# Patient Record
Sex: Female | Born: 1985 | Race: White | Hispanic: No | Marital: Married | Smoking: Former smoker
Health system: Southern US, Community
[De-identification: ages and names within clinical notes are randomized; demographics above are authoritative.]

## PROBLEM LIST (undated history)

## (undated) DIAGNOSIS — K219 Gastro-esophageal reflux disease without esophagitis: Secondary | ICD-10-CM

## (undated) DIAGNOSIS — D649 Anemia, unspecified: Secondary | ICD-10-CM

## (undated) DIAGNOSIS — F419 Anxiety disorder, unspecified: Secondary | ICD-10-CM

## (undated) DIAGNOSIS — R112 Nausea with vomiting, unspecified: Secondary | ICD-10-CM

## (undated) DIAGNOSIS — Z789 Other specified health status: Secondary | ICD-10-CM

## (undated) DIAGNOSIS — T8859XA Other complications of anesthesia, initial encounter: Secondary | ICD-10-CM

## (undated) DIAGNOSIS — J45909 Unspecified asthma, uncomplicated: Secondary | ICD-10-CM

## (undated) DIAGNOSIS — Z9889 Other specified postprocedural states: Secondary | ICD-10-CM

## (undated) DIAGNOSIS — R569 Unspecified convulsions: Secondary | ICD-10-CM

## (undated) DIAGNOSIS — T4145XA Adverse effect of unspecified anesthetic, initial encounter: Secondary | ICD-10-CM

## (undated) HISTORY — PX: SKIN SURGERY: SHX2413

## (undated) HISTORY — DX: Anxiety disorder, unspecified: F41.9

## (undated) HISTORY — DX: Nausea with vomiting, unspecified: R11.2

## (undated) HISTORY — DX: Anemia, unspecified: D64.9

## (undated) HISTORY — DX: Nausea with vomiting, unspecified: Z98.890

## (undated) HISTORY — DX: Other complications of anesthesia, initial encounter: T88.59XA

## (undated) HISTORY — DX: Unspecified convulsions: R56.9

## (undated) HISTORY — DX: Gastro-esophageal reflux disease without esophagitis: K21.9

## (undated) HISTORY — DX: Unspecified asthma, uncomplicated: J45.909

## (undated) HISTORY — DX: Other specified health status: Z78.9

## (undated) HISTORY — DX: Other specified postprocedural states: Z98.890

## (undated) HISTORY — DX: Adverse effect of unspecified anesthetic, initial encounter: T41.45XA

---

## 2010-02-06 ENCOUNTER — Encounter: Admission: RE | Admit: 2010-02-06 | Discharge: 2010-02-06 | Payer: Self-pay | Admitting: Family Medicine

## 2016-01-09 DIAGNOSIS — J45909 Unspecified asthma, uncomplicated: Secondary | ICD-10-CM | POA: Insufficient documentation

## 2016-01-09 DIAGNOSIS — F988 Other specified behavioral and emotional disorders with onset usually occurring in childhood and adolescence: Secondary | ICD-10-CM | POA: Insufficient documentation

## 2016-01-09 DIAGNOSIS — J302 Other seasonal allergic rhinitis: Secondary | ICD-10-CM | POA: Insufficient documentation

## 2016-07-11 ENCOUNTER — Encounter: Payer: Self-pay | Admitting: Internal Medicine

## 2016-07-18 DIAGNOSIS — J029 Acute pharyngitis, unspecified: Secondary | ICD-10-CM | POA: Insufficient documentation

## 2016-07-18 DIAGNOSIS — K219 Gastro-esophageal reflux disease without esophagitis: Secondary | ICD-10-CM | POA: Insufficient documentation

## 2016-07-18 DIAGNOSIS — K589 Irritable bowel syndrome without diarrhea: Secondary | ICD-10-CM | POA: Insufficient documentation

## 2016-09-17 ENCOUNTER — Ambulatory Visit: Payer: Self-pay | Admitting: Internal Medicine

## 2017-09-11 DIAGNOSIS — R5383 Other fatigue: Secondary | ICD-10-CM | POA: Insufficient documentation

## 2017-09-11 DIAGNOSIS — N946 Dysmenorrhea, unspecified: Secondary | ICD-10-CM | POA: Insufficient documentation

## 2017-09-11 DIAGNOSIS — R87619 Unspecified abnormal cytological findings in specimens from cervix uteri: Secondary | ICD-10-CM | POA: Insufficient documentation

## 2017-09-11 DIAGNOSIS — A749 Chlamydial infection, unspecified: Secondary | ICD-10-CM | POA: Insufficient documentation

## 2017-09-11 DIAGNOSIS — F32A Depression, unspecified: Secondary | ICD-10-CM | POA: Insufficient documentation

## 2017-10-10 LAB — OB RESULTS CONSOLE HEPATITIS B SURFACE ANTIGEN: Hepatitis B Surface Ag: NEGATIVE

## 2017-10-10 LAB — OB RESULTS CONSOLE RPR: RPR: NONREACTIVE

## 2017-10-10 LAB — OB RESULTS CONSOLE ANTIBODY SCREEN: Antibody Screen: NEGATIVE

## 2017-10-10 LAB — OB RESULTS CONSOLE RUBELLA ANTIBODY, IGM: Rubella: IMMUNE

## 2017-10-10 LAB — OB RESULTS CONSOLE GC/CHLAMYDIA
Chlamydia: NEGATIVE
GC PROBE AMP, GENITAL: NEGATIVE

## 2017-10-10 LAB — OB RESULTS CONSOLE ABO/RH: RH Type: POSITIVE

## 2017-10-10 LAB — OB RESULTS CONSOLE HIV ANTIBODY (ROUTINE TESTING): HIV: NONREACTIVE

## 2017-11-04 NOTE — L&D Delivery Note (Signed)
Delivery Note Pt with bradycardia to 90's - pushed 10-3215min for delivery.  At 5:16 AM a viable and healthy female was delivered via Vaginal, Spontaneous (Presentation: OA;LOT  ).  APGAR: 5, 9; weight P .   Placenta status: delivered, intact .  Cord: 3V with the following complications: none .    Anesthesia:  epidural Episiotomy: None Lacerations: 1st degree;Perineal Suture Repair: 3.0 vicryl rapide Est. Blood Loss (mL): 150cc  Mom to postpartum.  Baby to Couplet care / Skin to Skin.  Kelli Perez 05/22/2018, 5:38 AM  D/w parents circumcision for female infant inc r/b/a - wish to proceed  A+/RI/Tdap in PNC/Contra?Samuella Cota/BR

## 2017-12-19 DIAGNOSIS — O4440 Low lying placenta NOS or without hemorrhage, unspecified trimester: Secondary | ICD-10-CM | POA: Insufficient documentation

## 2017-12-19 DIAGNOSIS — O4442 Low lying placenta NOS or without hemorrhage, second trimester: Secondary | ICD-10-CM | POA: Insufficient documentation

## 2018-04-13 LAB — OB RESULTS CONSOLE GBS: STREP GROUP B AG: POSITIVE

## 2018-05-18 ENCOUNTER — Telehealth (HOSPITAL_COMMUNITY): Payer: Self-pay | Admitting: *Deleted

## 2018-05-18 ENCOUNTER — Encounter (HOSPITAL_COMMUNITY): Payer: Self-pay | Admitting: *Deleted

## 2018-05-18 NOTE — Telephone Encounter (Signed)
Preadmission screen  

## 2018-05-20 ENCOUNTER — Encounter (HOSPITAL_COMMUNITY): Payer: Self-pay | Admitting: *Deleted

## 2018-05-20 ENCOUNTER — Inpatient Hospital Stay (HOSPITAL_COMMUNITY)
Admission: AD | Admit: 2018-05-20 | Discharge: 2018-05-20 | Disposition: A | Discharge status: Home / Self Care | Payer: 59 | Source: Ambulatory Visit | Attending: Obstetrics and Gynecology | Admitting: Obstetrics and Gynecology

## 2018-05-20 DIAGNOSIS — O471 False labor at or after 37 completed weeks of gestation: Secondary | ICD-10-CM

## 2018-05-20 NOTE — MAU Note (Signed)
PT SAYS SHE IS AN INDUCTION Thursday AT 0715.     THIS AM  FEELS UC'S- STRONG . VE IN OFFICE ON Friday- FT-1 CM.  DENIES HSV AND  MRSA.  GBS- POSITIVE

## 2018-05-20 NOTE — Progress Notes (Signed)
Dr Ellyn HackBovard notified of pts presenting complaints. Order to watch and recheck x 1hr. Pt may ambulate x 1hr

## 2018-05-20 NOTE — Discharge Instructions (Signed)
Braxton Hicks Contractions °Contractions of the uterus can occur throughout pregnancy, but they are not always a sign that you are in labor. You may have practice contractions called Braxton Hicks contractions. These false labor contractions are sometimes confused with true labor. °What are Braxton Hicks contractions? °Braxton Hicks contractions are tightening movements that occur in the muscles of the uterus before labor. Unlike true labor contractions, these contractions do not result in opening (dilation) and thinning of the cervix. Toward the end of pregnancy (32-34 weeks), Braxton Hicks contractions can happen more often and may become stronger. These contractions are sometimes difficult to tell apart from true labor because they can be very uncomfortable. You should not feel embarrassed if you go to the hospital with false labor. °Sometimes, the only way to tell if you are in true labor is for your health care provider to look for changes in the cervix. The health care provider will do a physical exam and may monitor your contractions. If you are not in true labor, the exam should show that your cervix is not dilating and your water has not broken. °If there are other health problems associated with your pregnancy, it is completely safe for you to be sent home with false labor. You may continue to have Braxton Hicks contractions until you go into true labor. °How to tell the difference between true labor and false labor °True labor °· Contractions last 30-70 seconds. °· Contractions become very regular. °· Discomfort is usually felt in the top of the uterus, and it spreads to the lower abdomen and low back. °· Contractions do not go away with walking. °· Contractions usually become more intense and increase in frequency. °· The cervix dilates and gets thinner. °False labor °· Contractions are usually shorter and not as strong as true labor contractions. °· Contractions are usually irregular. °· Contractions  are often felt in the front of the lower abdomen and in the groin. °· Contractions may go away when you walk around or change positions while lying down. °· Contractions get weaker and are shorter-lasting as time goes on. °· The cervix usually does not dilate or become thin. °Follow these instructions at home: °· Take over-the-counter and prescription medicines only as told by your health care provider. °· Keep up with your usual exercises and follow other instructions from your health care provider. °· Eat and drink lightly if you think you are going into labor. °· If Braxton Hicks contractions are making you uncomfortable: °? Change your position from lying down or resting to walking, or change from walking to resting. °? Sit and rest in a tub of warm water. °? Drink enough fluid to keep your urine pale yellow. Dehydration may cause these contractions. °? Do slow and deep breathing several times an hour. °· Keep all follow-up prenatal visits as told by your health care provider. This is important. °Contact a health care provider if: °· You have a fever. °· You have continuous pain in your abdomen. °Get help right away if: °· Your contractions become stronger, more regular, and closer together. °· You have fluid leaking or gushing from your vagina. °· You pass blood-tinged mucus (bloody show). °· You have bleeding from your vagina. °· You have low back pain that you never had before. °· You feel your baby’s head pushing down and causing pelvic pressure. °· Your baby is not moving inside you as much as it used to. °Summary °· Contractions that occur before labor are called Braxton   Hicks contractions, false labor, or practice contractions. °· Braxton Hicks contractions are usually shorter, weaker, farther apart, and less regular than true labor contractions. True labor contractions usually become progressively stronger and regular and they become more frequent. °· Manage discomfort from Braxton Hicks contractions by  changing position, resting in a warm bath, drinking plenty of water, or practicing deep breathing. °This information is not intended to replace advice given to you by your health care provider. Make sure you discuss any questions you have with your health care provider. °Document Released: 03/06/2017 Document Revised: 03/06/2017 Document Reviewed: 03/06/2017 °Elsevier Interactive Patient Education © 2018 [employer] ° °Fetal Movement Counts °Patient Name: ________________________________________________ Patient Due Date: ____________________ °What is a fetal movement count? °A fetal movement count is the number of times that you feel your baby move during a certain amount of time. This may also be called a fetal kick count. A fetal movement count is recommended for every pregnant woman. You may be asked to start counting fetal movements as early as week 28 of your pregnancy. °Pay attention to when your baby is most active. You may notice your baby's sleep and wake cycles. You may also notice things that make your baby move more. You should do a fetal movement count: °· When your baby is normally most active. °· At the same time each day. ° °A good time to count movements is while you are resting, after having something to eat and drink. °How do I count fetal movements? °1. Find a quiet, comfortable area. Sit, or lie down on your side. °2. Write down the date, the start time and stop time, and the number of movements that you felt between those two times. Take this information with you to your health care visits. °3. For 2 hours, count kicks, flutters, swishes, rolls, and jabs. You should feel at least 10 movements during 2 hours. °4. You may stop counting after you have felt 10 movements. °5. If you do not feel 10 movements in 2 hours, have something to eat and drink. Then, keep resting and counting for 1 hour. If you feel at least 4 movements during that hour, you may stop counting. °Contact a health care  provider if: °· You feel fewer than 4 movements in 2 hours. °· Your baby is not moving like he or she usually does. °Date: ____________ Start time: ____________ Stop time: ____________ Movements: ____________ °Date: ____________ Start time: ____________ Stop time: ____________ Movements: ____________ °Date: ____________ Start time: ____________ Stop time: ____________ Movements: ____________ °Date: ____________ Start time: ____________ Stop time: ____________ Movements: ____________ °Date: ____________ Start time: ____________ Stop time: ____________ Movements: ____________ °Date: ____________ Start time: ____________ Stop time: ____________ Movements: ____________ °Date: ____________ Start time: ____________ Stop time: ____________ Movements: ____________ °Date: ____________ Start time: ____________ Stop time: ____________ Movements: ____________ °Date: ____________ Start time: ____________ Stop time: ____________ Movements: ____________ °This information is not intended to replace advice given to you by your health care provider. Make sure you discuss any questions you have with your health care provider. °Document Released: 11/20/2006 Document Revised: 06/19/2016 Document Reviewed: 11/30/2015 °Elsevier Interactive Patient Education © 2018 [employer] ° °

## 2018-05-20 NOTE — H&P (Deleted)
  The note originally documented on this encounter has been moved the the encounter in which it belongs.  

## 2018-05-20 NOTE — H&P (Signed)
Kelli Perez is a 32 y.o. female G2P0010 at 38+ for IOL given term status.  +FM, no LOF,no VB, occ ctx.Eval in MAU 7/17, no change.  Relatively uncomplicated PNC - dated by LMP cw [place].  Negative genetic screening.  GBBS positive.  OB History    Gravida  2   Para      Term      Preterm      AB  1   Living        SAB  1   TAB      Ectopic      Multiple      Live Births            G1 TAB G2 present  No abn pap Remote h/o Chl  Past Medical History:  Diagnosis Date  . Complication of anesthesia   . Medical history non-contributory   . PONV (postoperative nausea and vomiting)    Past Surgical History:  Procedure Laterality Date  . SKIN SURGERY     birthmark removal on back   Family History: HTN, prostate CA Social History:  reports that she has never smoked. She has never used smokeless tobacco. She reports that she drank alcohol. She reports that she does not use drugs. married, customer service  Meds PNV All Cefzil, PCN, Sulfa - as child      Maternal Diabetes: No Genetic Screening: Normal Maternal Ultrasounds/Referrals: Normal Fetal Ultrasounds or other Referrals:  None Maternal Substance Abuse:  No Significant Maternal Medications:  None Significant Maternal Lab Results:  Lab values include: Group B Strep positive Other Comments:  None  Review of Systems  Constitutional: Negative.   HENT: Negative.   Eyes: Negative.   Respiratory: Negative.   Cardiovascular: Negative.   Gastrointestinal: Negative.   Genitourinary: Negative.   Musculoskeletal: Negative.   Skin: Negative.   Neurological: Negative.   Psychiatric/Behavioral: Negative.    Maternal Medical History:  Contractions: Frequency: irregular.   Perceived severity is moderate.    Fetal activity: Perceived fetal activity is normal.    Prenatal Complications - Diabetes: none.      Last menstrual period 08/11/2017. Maternal Exam:  Uterine Assessment: Contraction frequency is  irregular.   Abdomen: Patient reports no abdominal tenderness. Fundal height is appropriate for gestation.   Estimated fetal weight is 7.5-8#.   Fetal presentation: vertex     Physical Exam  Constitutional: She is oriented to person, place, and time. She appears well-developed and well-nourished.  HENT:  Head: Normocephalic and atraumatic.  Cardiovascular: Normal rate and regular rhythm.  Respiratory: Effort normal and breath sounds normal. No respiratory distress. She has no wheezes.  GI: Soft. Bowel sounds are normal. She exhibits no distension. There is no tenderness.  Musculoskeletal: Normal range of motion.  Neurological: She is alert and oriented to person, place, and time.  Skin: Skin is warm and dry.  Psychiatric: She has a normal mood and affect. Her behavior is normal.    Prenatal labs: ABO, Rh: A/Positive/-- (12/07 0000) Antibody: Negative (12/07 0000) Rubella: Immune (12/07 0000) RPR: Nonreactive (12/07 0000)  HBsAg: Negative (12/07 0000)  HIV: Non-reactive (12/07 0000)  GBS: Positive (06/10 0000)  EDC 05/18/18 - by LMP c/w [place] Tdap 4/23 Hgb 13.8/Plt336/Ur Cx neg/Chle neg/Gc neg/Varicella immune Nl NT First tri scr WNL AFP WNL/ glucola 109 Nl anat, LLP Repeat US resolved LLP Assessment/Plan: G2P0010 at 40+ for IOL  cytotec and AROM/Pitocin GBBS positive - AROM after abx - Clinda susceptible Expect SVD  Female -  circ paid   Lashanda Storlie Bovard-Stuckert 05/20/2018, 2:52 PM

## 2018-05-20 NOTE — MAU Note (Signed)
I have communicated with Dr. Ellyn HackBovard and reviewed vital signs:  Vitals:   05/20/18 0743 05/20/18 0812  BP: (!) 133/95 133/87  Pulse: 89 97  Resp:  18  Temp:      Vaginal exam:  Dilation: 1 Effacement (%): 50 Cervical Position: Posterior Station: -3 Presentation: Vertex Exam by:: Marvel PlanJessica Shawneen Deetz RN ,   Also reviewed contraction pattern and that non-stress test is reactive.  It has been documented that patient is contracting irregularly  with no cervical change over 2 hours not indicating active labor.  Patient denies any other complaints.  Based on this report provider has given order for discharge.  A discharge order and diagnosis entered by a provider.   Labor discharge instructions reviewed with patient.

## 2018-05-21 ENCOUNTER — Encounter (HOSPITAL_COMMUNITY): Payer: Self-pay | Admitting: *Deleted

## 2018-05-21 ENCOUNTER — Inpatient Hospital Stay (HOSPITAL_COMMUNITY)
Admission: AD | Admit: 2018-05-21 | Discharge: 2018-05-24 | DRG: 807 | Disposition: A | Discharge status: Home / Self Care | Payer: 59 | Attending: Obstetrics and Gynecology | Admitting: Obstetrics and Gynecology

## 2018-05-21 ENCOUNTER — Other Ambulatory Visit: Payer: Self-pay

## 2018-05-21 ENCOUNTER — Inpatient Hospital Stay (HOSPITAL_COMMUNITY)
Admission: RE | Admit: 2018-05-21 | Discharge: 2018-05-21 | Disposition: A | Discharge status: Home / Self Care | Payer: 59 | Source: Ambulatory Visit | Attending: Obstetrics and Gynecology | Admitting: Obstetrics and Gynecology

## 2018-05-21 DIAGNOSIS — Z3A40 40 weeks gestation of pregnancy: Secondary | ICD-10-CM

## 2018-05-21 DIAGNOSIS — Z3490 Encounter for supervision of normal pregnancy, unspecified, unspecified trimester: Secondary | ICD-10-CM

## 2018-05-21 DIAGNOSIS — O99824 Streptococcus B carrier state complicating childbirth: Secondary | ICD-10-CM | POA: Diagnosis present

## 2018-05-21 DIAGNOSIS — O480 Post-term pregnancy: Principal | ICD-10-CM | POA: Diagnosis present

## 2018-05-21 DIAGNOSIS — Z3493 Encounter for supervision of normal pregnancy, unspecified, third trimester: Secondary | ICD-10-CM

## 2018-05-21 LAB — COMPREHENSIVE METABOLIC PANEL
ALT: 21 U/L (ref 0–44)
ANION GAP: 12 (ref 5–15)
AST: 21 U/L (ref 15–41)
Albumin: 2.9 g/dL — ABNORMAL LOW (ref 3.5–5.0)
Alkaline Phosphatase: 99 U/L (ref 38–126)
BUN: 10 mg/dL (ref 6–20)
CALCIUM: 8.5 mg/dL — AB (ref 8.9–10.3)
CHLORIDE: 105 mmol/L (ref 98–111)
CO2: 18 mmol/L — AB (ref 22–32)
Creatinine, Ser: 0.62 mg/dL (ref 0.44–1.00)
Glucose, Bld: 94 mg/dL (ref 70–99)
Potassium: 3.5 mmol/L (ref 3.5–5.1)
SODIUM: 135 mmol/L (ref 135–145)
Total Bilirubin: 0.4 mg/dL (ref 0.3–1.2)
Total Protein: 6.6 g/dL (ref 6.5–8.1)

## 2018-05-21 LAB — CBC
HCT: 40.9 % (ref 36.0–46.0)
Hemoglobin: 14.2 g/dL (ref 12.0–15.0)
MCH: 31.3 pg (ref 26.0–34.0)
MCHC: 34.7 g/dL (ref 30.0–36.0)
MCV: 90.3 fL (ref 78.0–100.0)
PLATELETS: 243 10*3/uL (ref 150–400)
RBC: 4.53 MIL/uL (ref 3.87–5.11)
RDW: 13.9 % (ref 11.5–15.5)
WBC: 13.7 10*3/uL — AB (ref 4.0–10.5)

## 2018-05-21 LAB — TYPE AND SCREEN
ABO/RH(D): A POS
Antibody Screen: NEGATIVE

## 2018-05-21 LAB — ABO/RH: ABO/RH(D): A POS

## 2018-05-21 MED ORDER — TERBUTALINE SULFATE 1 MG/ML IJ SOLN
0.2500 mg | Freq: Once | INTRAMUSCULAR | Status: DC | PRN
  Filled 2018-05-21: qty 1

## 2018-05-21 MED ORDER — MISOPROSTOL 25 MCG QUARTER TABLET
25.0000 ug | ORAL_TABLET | ORAL | Status: DC | PRN
  Administered 2018-05-21: 25 ug via VAGINAL
  Filled 2018-05-21: qty 1

## 2018-05-21 MED ORDER — ACETAMINOPHEN 325 MG PO TABS: 650.0000 mg | ORAL_TABLET | ORAL | Status: DC | PRN

## 2018-05-21 MED ORDER — OXYTOCIN 40 UNITS IN LACTATED RINGERS INFUSION - SIMPLE MED
2.5000 [IU]/h | INTRAVENOUS | Status: DC
  Filled 2018-05-21: qty 1000

## 2018-05-21 MED ORDER — MISOPROSTOL 25 MCG QUARTER TABLET
ORAL_TABLET | ORAL | Status: AC
  Filled 2018-05-21: qty 1

## 2018-05-21 MED ORDER — OXYTOCIN BOLUS FROM INFUSION
500.0000 mL | Freq: Once | INTRAVENOUS | Status: AC
  Administered 2018-05-22: 500 mL via INTRAVENOUS

## 2018-05-21 MED ORDER — OXYCODONE-ACETAMINOPHEN 5-325 MG PO TABS: 2.0000 | ORAL_TABLET | ORAL | Status: DC | PRN

## 2018-05-21 MED ORDER — OXYTOCIN 40 UNITS IN LACTATED RINGERS INFUSION - SIMPLE MED
1.0000 m[IU]/min | INTRAVENOUS | Status: DC
  Filled 2018-05-21: qty 1000

## 2018-05-21 MED ORDER — OXYCODONE-ACETAMINOPHEN 5-325 MG PO TABS: 1.0000 | ORAL_TABLET | ORAL | Status: DC | PRN

## 2018-05-21 MED ORDER — ONDANSETRON HCL 4 MG/2ML IJ SOLN: 4.0000 mg | Freq: Four times a day (QID) | INTRAMUSCULAR | Status: DC | PRN

## 2018-05-21 MED ORDER — LACTATED RINGERS IV SOLN
INTRAVENOUS | Status: DC
  Administered 2018-05-21 – 2018-05-22 (×3): via INTRAVENOUS

## 2018-05-21 MED ORDER — LIDOCAINE HCL (PF) 1 % IJ SOLN
30.0000 mL | INTRAMUSCULAR | Status: DC | PRN
  Filled 2018-05-21: qty 30

## 2018-05-21 MED ORDER — CLINDAMYCIN PHOSPHATE 900 MG/50ML IV SOLN
900.0000 mg | Freq: Three times a day (TID) | INTRAVENOUS | Status: DC
  Administered 2018-05-21 – 2018-05-22 (×2): 900 mg via INTRAVENOUS
  Filled 2018-05-21 (×3): qty 50

## 2018-05-21 MED ORDER — BUTORPHANOL TARTRATE 1 MG/ML IJ SOLN: 1.0000 mg | INTRAMUSCULAR | Status: DC | PRN

## 2018-05-21 MED ORDER — OXYTOCIN 40 UNITS IN LACTATED RINGERS INFUSION - SIMPLE MED
1.0000 m[IU]/min | INTRAVENOUS | Status: DC
  Administered 2018-05-21: 2 m[IU]/min via INTRAVENOUS

## 2018-05-21 MED ORDER — SOD CITRATE-CITRIC ACID 500-334 MG/5ML PO SOLN: 30.0000 mL | ORAL | Status: DC | PRN

## 2018-05-21 MED ORDER — LACTATED RINGERS IV SOLN
500.0000 mL | INTRAVENOUS | Status: DC | PRN
  Administered 2018-05-22: 500 mL via INTRAVENOUS

## 2018-05-21 NOTE — Anesthesia Pain Management Evaluation Note (Signed)
  CRNA Pain Management Visit Note  Patient: Kelli Perez, 32 y.o., female  "Hello I am a member of the anesthesia team at [facility]. We have an anesthesia team available at all times to provide care throughout the hospital, including epidural management and anesthesia for C-section. I don't know your plan for the delivery whether it a natural birth, water birth, IV sedation, nitrous supplementation, doula or epidural, but we want to meet your pain goals."   1.Was your pain managed to your expectations on prior hospitalizations?   Yes   2.What is your expectation for pain management during this hospitalization?     Epidural  3.How can we help you reach that goal? Support prn  Record the patient's initial score and the patient's pain goal.   Pain: 4  Pain Goal: 6 [facility] wants you to be able to say your pain was always managed very well.  [facility] IncWRINK[facility],Paulina Muchmore 05/21/2018

## 2018-05-21 NOTE — Progress Notes (Signed)
Patient ID: Kelli Perez, female   DOB: 01/13/1986, 32 y.o.   MRN: [id number]   Pt having some ctx, doing well  AFVSS gen NAD FHTs 140's, mod var, + accels, category 1 toco irr q 2-4  SVE 3/50/-2  AROM for light mec D/w pt, will monitor  Plan for SVD

## 2018-05-21 NOTE — MAU Note (Signed)
Here for induction, waiting on room

## 2018-05-22 ENCOUNTER — Other Ambulatory Visit: Payer: Self-pay

## 2018-05-22 ENCOUNTER — Inpatient Hospital Stay (HOSPITAL_COMMUNITY): Payer: 59 | Admitting: Anesthesiology

## 2018-05-22 ENCOUNTER — Encounter (HOSPITAL_COMMUNITY): Payer: Self-pay | Admitting: Anesthesiology

## 2018-05-22 LAB — RPR: RPR Ser Ql: NONREACTIVE

## 2018-05-22 MED ORDER — DIPHENHYDRAMINE HCL 25 MG PO CAPS: 25.0000 mg | ORAL_CAPSULE | Freq: Four times a day (QID) | ORAL | Status: DC | PRN

## 2018-05-22 MED ORDER — DIBUCAINE 1 % RE OINT: 1.0000 "application " | TOPICAL_OINTMENT | RECTAL | Status: DC | PRN

## 2018-05-22 MED ORDER — PHENYLEPHRINE 40 MCG/ML (10ML) SYRINGE FOR IV PUSH (FOR BLOOD PRESSURE SUPPORT)
80.0000 ug | PREFILLED_SYRINGE | INTRAVENOUS | Status: DC | PRN
  Filled 2018-05-22: qty 5

## 2018-05-22 MED ORDER — OXYCODONE HCL 5 MG PO TABS: 10.0000 mg | ORAL_TABLET | ORAL | Status: DC | PRN

## 2018-05-22 MED ORDER — IBUPROFEN 600 MG PO TABS
ORAL_TABLET | ORAL | Status: AC
  Administered 2018-05-22: 600 mg via ORAL
  Filled 2018-05-22: qty 1

## 2018-05-22 MED ORDER — COCONUT OIL OIL: 1.0000 "application " | TOPICAL_OIL | Status: DC | PRN

## 2018-05-22 MED ORDER — OXYCODONE HCL 5 MG PO TABS: 5.0000 mg | ORAL_TABLET | ORAL | Status: DC | PRN

## 2018-05-22 MED ORDER — SIMETHICONE 80 MG PO CHEW: 80.0000 mg | CHEWABLE_TABLET | ORAL | Status: DC | PRN

## 2018-05-22 MED ORDER — SENNOSIDES-DOCUSATE SODIUM 8.6-50 MG PO TABS
2.0000 | ORAL_TABLET | ORAL | Status: DC
  Administered 2018-05-22 – 2018-05-24 (×2): 2 via ORAL
  Filled 2018-05-22 (×2): qty 2

## 2018-05-22 MED ORDER — LACTATED RINGERS IV SOLN: INTRAVENOUS | Status: DC

## 2018-05-22 MED ORDER — LACTATED RINGERS IV SOLN
500.0000 mL | Freq: Once | INTRAVENOUS | Status: AC
  Administered 2018-05-22: 500 mL via INTRAVENOUS

## 2018-05-22 MED ORDER — DIPHENHYDRAMINE HCL 50 MG/ML IJ SOLN: 12.5000 mg | INTRAMUSCULAR | Status: DC | PRN

## 2018-05-22 MED ORDER — BENZOCAINE-MENTHOL 20-0.5 % EX AERO: 1.0000 "application " | INHALATION_SPRAY | CUTANEOUS | Status: DC | PRN

## 2018-05-22 MED ORDER — ZOLPIDEM TARTRATE 5 MG PO TABS: 5.0000 mg | ORAL_TABLET | Freq: Every evening | ORAL | Status: DC | PRN

## 2018-05-22 MED ORDER — FENTANYL 2.5 MCG/ML BUPIVACAINE 1/10 % EPIDURAL INFUSION (WH - ANES)
14.0000 mL/h | INTRAMUSCULAR | Status: DC | PRN
  Administered 2018-05-22: 14 mL/h via EPIDURAL
  Filled 2018-05-22: qty 100

## 2018-05-22 MED ORDER — PHENYLEPHRINE 40 MCG/ML (10ML) SYRINGE FOR IV PUSH (FOR BLOOD PRESSURE SUPPORT)
80.0000 ug | PREFILLED_SYRINGE | INTRAVENOUS | Status: DC | PRN
  Filled 2018-05-22: qty 5
  Filled 2018-05-22: qty 10

## 2018-05-22 MED ORDER — ONDANSETRON HCL 4 MG/2ML IJ SOLN: 4.0000 mg | INTRAMUSCULAR | Status: DC | PRN

## 2018-05-22 MED ORDER — FENTANYL CITRATE (PF) 100 MCG/2ML IJ SOLN
INTRAMUSCULAR | Status: AC
  Administered 2018-05-22: 100 ug
  Filled 2018-05-22: qty 2

## 2018-05-22 MED ORDER — CALCIUM CARBONATE ANTACID 500 MG PO CHEW
2.0000 | CHEWABLE_TABLET | Freq: Three times a day (TID) | ORAL | Status: DC | PRN
Start: 2018-05-22 — End: 2018-05-24

## 2018-05-22 MED ORDER — ONDANSETRON HCL 4 MG PO TABS: 4.0000 mg | ORAL_TABLET | ORAL | Status: DC | PRN

## 2018-05-22 MED ORDER — ACETAMINOPHEN 325 MG PO TABS
650.0000 mg | ORAL_TABLET | ORAL | Status: DC | PRN
  Administered 2018-05-22: 650 mg via ORAL
  Filled 2018-05-22: qty 2

## 2018-05-22 MED ORDER — PRENATAL MULTIVITAMIN CH
1.0000 | ORAL_TABLET | Freq: Every day | ORAL | Status: DC
  Administered 2018-05-22 – 2018-05-24 (×3): 1 via ORAL
  Filled 2018-05-22 (×3): qty 1

## 2018-05-22 MED ORDER — EPHEDRINE 5 MG/ML INJ
10.0000 mg | INTRAVENOUS | Status: DC | PRN
  Filled 2018-05-22: qty 2

## 2018-05-22 MED ORDER — WITCH HAZEL-GLYCERIN EX PADS: 1.0000 "application " | MEDICATED_PAD | CUTANEOUS | Status: DC | PRN

## 2018-05-22 MED ORDER — LIDOCAINE HCL (PF) 1 % IJ SOLN
INTRAMUSCULAR | Status: DC | PRN
  Administered 2018-05-22: 7 mL via EPIDURAL
  Administered 2018-05-22: 5 mL via EPIDURAL

## 2018-05-22 MED ORDER — IBUPROFEN 600 MG PO TABS
600.0000 mg | ORAL_TABLET | Freq: Four times a day (QID) | ORAL | Status: DC
  Administered 2018-05-22 – 2018-05-24 (×10): 600 mg via ORAL
  Filled 2018-05-22 (×9): qty 1

## 2018-05-22 MED ORDER — FENTANYL CITRATE (PF) 100 MCG/2ML IJ SOLN: 100.0000 ug | INTRAMUSCULAR | Status: DC | PRN

## 2018-05-22 NOTE — Anesthesia Preprocedure Evaluation (Signed)
Anesthesia Evaluation  Patient identified by MRN, date of birth, ID band Patient awake    Reviewed: Allergy & Precautions, H&P , NPO status , Patient's Chart, lab work & pertinent test results  History of Anesthesia Complications (+) PONV and history of anesthetic complications  Airway Mallampati: II  TM Distance: >3 FB Neck ROM: full    Dental no notable dental hx. (+) Teeth Intact   Pulmonary neg pulmonary ROS,    Pulmonary exam normal breath sounds clear to auscultation       Cardiovascular negative cardio ROS Normal cardiovascular exam Rhythm:regular Rate:Normal     Neuro/Psych negative neurological ROS  negative psych ROS   GI/Hepatic negative GI ROS, Neg liver ROS,   Endo/Other  negative endocrine ROS  Renal/GU negative Renal ROS  negative genitourinary   Musculoskeletal negative musculoskeletal ROS (+)   Abdominal (+) + obese,   Peds  Hematology negative hematology ROS (+)   Anesthesia Other Findings   Reproductive/Obstetrics (+) Pregnancy                             Anesthesia Physical  Anesthesia Plan  ASA: II  Anesthesia Plan: Epidural   Post-op Pain Management:    Induction:   PONV Risk Score and Plan:   Airway Management Planned:   Additional Equipment:   Intra-op Plan:   Post-operative Plan:   Informed Consent: I have reviewed the patients History and Physical, chart, labs and discussed the procedure including the risks, benefits and alternatives for the proposed anesthesia with the patient or authorized representative who has indicated his/her understanding and acceptance.       Plan Discussed with:   Anesthesia Plan Comments:         Anesthesia Quick Evaluation  

## 2018-05-22 NOTE — Anesthesia Postprocedure Evaluation (Signed)
Anesthesia Post Note  Patient: Environmental managerMeghan Perez  Procedure(s) Performed: AN AD HOC LABOR EPIDURAL     Patient location during evaluation: Mother Baby Anesthesia Type: Epidural Level of consciousness: awake Pain management: pain level controlled Vital Signs Assessment: post-procedure vital signs reviewed and stable Respiratory status: spontaneous breathing Cardiovascular status: stable Postop Assessment: no headache, no backache, epidural receding, patient able to bend at knees, no apparent nausea or vomiting, adequate PO intake and able to ambulate Anesthetic complications: no    Last Vitals:  Vitals:   05/22/18 0825 05/22/18 0926  BP: 132/79 (!) 146/88  Pulse: 99 100  Resp: 16 18  Temp: 37.3 C 36.9 C  SpO2: 100% 100%    Last Pain:  Vitals:   05/22/18 0926  TempSrc: Oral  PainSc: 1    Pain Goal:                 [name]

## 2018-05-22 NOTE — Progress Notes (Signed)
Post Partum Day 0 Subjective: Doing well.  Bleeding normal.  Pain controlled  Objective: Blood pressure 132/79, pulse 99, temperature 99.1 F (37.3 C), temperature source Oral, resp. rate 16, height 5\' 8"  (1.727 m), weight 96.2 kg (212 lb), last menstrual period 08/11/2017, SpO2 100 %, unknown if currently breastfeeding.  Physical Exam:  General: alert and cooperative Fundus firm  Recent Labs    05/21/18 1717  HGB 14.2  HCT 40.9    Assessment/Plan: Routine care D/w pt circumcision and plan tomorrow AM  LOS: 1 day   Oliver PilaKathy W Tieisha Darden 05/22/2018, 9:09 AM

## 2018-05-22 NOTE — Anesthesia Procedure Notes (Signed)
Epidural Patient location during procedure: OB Start time: 05/22/2018 3:18 AM End time: 05/22/2018 3:22 AM  Staffing Anesthesiologist: Leilani AbleHatchett, Uzma Hellmer, MD Performed: anesthesiologist   Preanesthetic Checklist Completed: patient identified, site marked, surgical consent, pre-op evaluation, timeout performed, IV checked, risks and benefits discussed and monitors and equipment checked  Epidural Patient position: sitting Prep: site prepped and draped and DuraPrep Patient monitoring: continuous pulse ox and blood pressure Approach: midline Location: L3-L4 Injection technique: LOR air  Needle:  Needle type: Tuohy  Needle gauge: 17 G Needle length: 9 cm and 9 Needle insertion depth: 6 cm Catheter type: closed end flexible Catheter size: 19 Gauge Catheter at skin depth: 11 cm Test dose: negative and Other  Assessment Sensory level: T9 Events: blood not aspirated, injection not painful, no injection resistance, negative IV test and no paresthesia  Additional Notes Reason for block:procedure for pain

## 2018-05-22 NOTE — Lactation Note (Signed)
This note was copied from a baby's chart. Lactation Consultation Note  Patient Name: Kelli Perez OZHYQ'MToday's Date: 05/22/2018 Reason for consult: Initial assessment;1st time breastfeeding;Primapara;Term  P1 mother whose infant is now 5716 hours old.  Mother is requesting latch assistance.  Baby fussy as I entered and showing feeding cues.  Attempted to calm him down by letting him suck on my gloved finger.  He has a biting motion and sucks hard for 3-4 sucks before releasing.  Mother stated that is exactly what he does at the breast.  Mother's breasts are soft and non tender with everted nipples.  Assisted baby to latch on the right breast in the football hold.  After repeated attempts he was able to latch.  He continued to suck 3-4 times and would release.  Continued to redirect him to the breast and he would suck again for 3-4 sucks.  Burped baby and continued in this fashion.  He does have a wide open mouth when he latches and mother feels no pain.  Encouraged her to keep putting him to the breast every time he self releases.  After 10 minutes he burped and I assisted with latching in the football hold on the left breast.  At times he wants to "lick" the nipple so mother aware to be sure to wait for the wide gape.  Encouraged feeding 8-12 times/24 hours or more if he shows feeding cues.  Continue STS, breast massage and hand expression after feedings.  Mother demonstrated hand expression and was able to express a few drops of colostrum which I finger fed back to him.  Mother aware of feeding cues and will awaken baby by 3 hours if he does not self awaken.  Mom made aware of O/P services, breastfeeding support groups, community resources, and our phone # for post-discharge questions. Father present and sleeping on couch.  Rn updated.  Mother will call for assistance as needed.   Maternal Data Formula Feeding for Exclusion: No Has patient been taught Hand Expression?: Yes Does the patient have  breastfeeding experience prior to this delivery?: No  Feeding Feeding Type: Breast Fed Length of feed: 20 min  LATCH Score Latch: Repeated attempts needed to sustain latch, nipple held in mouth throughout feeding, stimulation needed to elicit sucking reflex.  Audible Swallowing: A few with stimulation  Type of Nipple: Everted at rest and after stimulation  Comfort (Breast/Nipple): Soft / non-tender  Hold (Positioning): Assistance needed to correctly position infant at breast and maintain latch.  LATCH Score: 7  Interventions Interventions: Breast feeding basics reviewed;Assisted with latch;Skin to skin;Breast massage;Hand express;Position options;Support pillows;Adjust position;Breast compression  Lactation Tools Discussed/Used WIC Program: No   Consult Status Consult Status: Follow-up Date: 05/23/18 Follow-up type: In-patient    Alexismarie Flaim R Henry Utsey 05/22/2018, 10:07 PM

## 2018-05-23 LAB — CBC
HEMATOCRIT: 34.6 % — AB (ref 36.0–46.0)
HEMOGLOBIN: 11.5 g/dL — AB (ref 12.0–15.0)
MCH: 30.4 pg (ref 26.0–34.0)
MCHC: 33.2 g/dL (ref 30.0–36.0)
MCV: 91.5 fL (ref 78.0–100.0)
Platelets: 204 10*3/uL (ref 150–400)
RBC: 3.78 MIL/uL — AB (ref 3.87–5.11)
RDW: 14.4 % (ref 11.5–15.5)
WBC: 12.8 10*3/uL — ABNORMAL HIGH (ref 4.0–10.5)

## 2018-05-23 NOTE — Progress Notes (Signed)
Post Partum Day 1 Subjective: no complaints, up ad lib and tolerating PO  Objective: Blood pressure 121/79, pulse 81, temperature 98.5 F (36.9 C), temperature source Oral, resp. rate 18, height 5\' 8"  (1.727 m), weight 96.2 kg (212 lb), last menstrual period 08/11/2017, SpO2 97 %, unknown if currently breastfeeding.  Physical Exam:  General: alert and cooperative Lochia: appropriate Uterine Fundus: firm   Recent Labs    05/21/18 1717 05/23/18 0532  HGB 14.2 11.5*  HCT 40.9 34.6*    Assessment/Plan: Plan for discharge tomorrow   LOS: 2 days   Kelli Perez 05/23/2018, 2:00 PM

## 2018-05-24 MED ORDER — ACETAMINOPHEN 325 MG PO TABS: 650.0000 mg | ORAL_TABLET | ORAL | 0 refills | Status: DC | PRN

## 2018-05-24 MED ORDER — IBUPROFEN 600 MG PO TABS: 600.0000 mg | ORAL_TABLET | Freq: Four times a day (QID) | ORAL | 0 refills | Status: DC

## 2018-05-24 NOTE — Lactation Note (Addendum)
This note was copied from a baby's chart. Lactation Consultation Note  Patient Name: Kelli Perez PewMeghan Cheema LKGMW'NToday's Date: 05/24/2018 Reason for consult: Follow-up assessment;Infant weight loss;Primapara;1st time breastfeeding;Term;Difficult latch;Other (Comment)(7% weight loss / using a NS , switched size to #24 )  Baby is 4652 hours old  LC reviewed doc flow sheets with parents / updated  Baby awake and hungry/ rooting.  Mom had applied the #20 NS and LC felt it appeared to snug, resized and the #24 NS fit better,  Filled with EBM and assisted to latch in the cross cradle position/ multiple swallows noted and baby fed for 8- 10 mins.  And was satisfied afterwards/ nipple well rounded and per mom comfortable with latch.  Discussed nutritive vs non - nutritive feeding patterns and the importance of watching for hanging out latched.  LC assessed oral cavity with gloved fingers, ( mom mentioned the Pedis noted a short frenulum ) .  LC noted a short anterior notch, baby able to raise tongue almost to the corners of the mouth and extend a short  Distance over the gum line, high palate, able to open mouth well to accommodate a #24 NS well with depth.  Also keeps is tongue down when sucking, and chewing as previously mentioned by another LC.  LC reviewed the LC plan:  Breast shells between feedings except when sleeping  Prior to latch - breast massage, hand express, pre-pump if needed - hand pump.  Apply NS #24 and instill EBM into the top / appetizer/ latch with firm support.  Feed for 15 - 20 mins , 30 mins max and supplement with EBM afterwards up to 30 ml.  Post pump both breast for 10 - 15 mins, save milk for the next feeding.  LC gave mom a written plan with the above information.  Sore nipple and engorgement prevention and tx reviewed.  Mom has hand pump, nipple shields, and DEBP Medela.  And shells.  Mom and dad receptive to returning [facility] this week,  And LC [facility] For appt. By Wednesday or Thursday / Friday at the latest.  Mother informed of post-discharge support and given phone number to the lactation department, including services for phone call assistance; out-patient appointments; and breastfeeding support group. List of other breastfeeding resources in the community given in the handout. Encouraged mother to call for problems or concerns related to breastfeeding.  Maternal Data Has patient been taught Hand Expression?: Yes  Feeding Feeding Type: Breast Fed Length of feed: 8 min(with EBM in the nipples)  LATCH Score Latch: Grasps breast easily, tongue down, lips flanged, rhythmical sucking.  Audible Swallowing: Spontaneous and intermittent  Type of Nipple: Everted at rest and after stimulation  Comfort (Breast/Nipple): Filling, red/small blisters or bruises, mild/mod discomfort  Hold (Positioning): Assistance needed to correctly position infant at breast and maintain latch.  LATCH Score: 8  Interventions Interventions: Breast feeding basics reviewed;Assisted with latch;Skin to skin;Breast massage;Breast compression;Adjust position;Support pillows;Position options;Shells;Hand pump;DEBP  Lactation Tools Discussed/Used Tools: Shells;Pump Nipple shield size: 20;24;Other (comment)(#24 NS a better fit ) Shell Type: Inverted Breast pump type: Double-Electric Breast Pump;Manual Pump Review: Setup, frequency, and cleaning;Milk Storage(LC reviewed )   Consult Status Consult Status: Follow-up Date: ([facility] and [facility]P appt. ) Follow-up type: Out-patient    Matilde SprangMargaret Ann Sasan Wilkie 05/24/2018, 10:06 AM

## 2018-05-24 NOTE — Plan of Care (Signed)
Progressing appropriately. Encouraged to call for assistance as needed, and for LATCH assessment.  

## 2018-05-24 NOTE — Discharge Summary (Signed)
OB Discharge Summary     Patient Name: Kelli PewMeghan Perez DOB: 03/01/1986 MRN: [id number]  Date of admission: 05/21/2018 Delivering MD: Sherian ReinBOVARD-STUCKERT, JODY   Date of discharge: 05/24/2018  Admitting diagnosis: 1411 Intrauterine pregnancy: 4870w4d     Secondary diagnosis:  Principal Problem:   SVD (spontaneous vaginal delivery) Active Problems:   Normal pregnancy, third trimester  Additional problems: none     Discharge diagnosis: Term Pregnancy Delivered                                                                                                Post partum procedures:none  Augmentation: AROM and Pitocin  Complications: None  Hospital course:  Induction of Labor With Vaginal Delivery   32 y.o. yo G2P1011 at 5070w4d was admitted to the hospital 05/21/2018 for induction of labor.  Indication for induction: Postdates.  Patient had an uncomplicated labor course as follows: Membrane Rupture Time/Date: 10:31 PM ,05/21/2018   Intrapartum Procedures: Episiotomy: None [1]                                         Lacerations:  1st degree [2];Perineal [11]  Patient had delivery of a Viable infant.  Information for the patient's newborn:  Kelli Perez, Boy Kelli Perez [[id number]][[id number]]  Delivery Method: Vag-Spont   05/22/2018  Details of delivery can be found in separate delivery note.  Patient had a routine postpartum course. Patient is discharged home 05/24/18.  Physical exam  Vitals:   05/23/18 0640 05/23/18 1400 05/23/18 2207 05/24/18 0601  BP: 121/79 130/85 133/86 (!) 136/94  Pulse: 81 80 88 83  Resp: 18 18  18   Temp: 98.5 F (36.9 C) 98.4 F (36.9 C) 98.9 F (37.2 C) 98.6 F (37 C)  TempSrc: Oral  Oral Oral  SpO2: 97% 98%    Weight:      Height:       General: alert and cooperative Lochia: appropriate Uterine Fundus: firm  Labs: Lab Results  Component Value Date   WBC 12.8 (H) 05/23/2018   HGB 11.5 (L) 05/23/2018   HCT 34.6 (L) 05/23/2018   MCV 91.5 05/23/2018   PLT 204 05/23/2018    CMP Latest Ref Rng & Units 05/21/2018  Glucose 70 - 99 mg/dL 94  BUN 6 - 20 mg/dL 10  Creatinine 1.300.44 - 8.651.00 mg/dL 7.840.62  Sodium 696135 - 295145 mmol/L 135  Potassium 3.5 - 5.1 mmol/L 3.5  Chloride 98 - 111 mmol/L 105  CO2 22 - 32 mmol/L 18(L)  Calcium 8.9 - 10.3 mg/dL 2.8(U8.5(L)  Total Protein 6.5 - 8.1 g/dL 6.6  Total Bilirubin 0.3 - 1.2 mg/dL 0.4  Alkaline Phos 38 - 126 U/L 99  AST 15 - 41 U/L 21  ALT 0 - 44 U/L 21    Discharge instruction: per After Visit Summary and "Baby and Me Booklet".  After visit meds:  Allergies as of 05/24/2018      Reactions   Cefzil [cefprozil] Other (See Comments)   Uknown   Penicillins  Other (See Comments)   Unknown   Sulfa Antibiotics Other (See Comments)   Uknown reaction.      Medication List    STOP taking these medications   calcium carbonate 500 MG chewable tablet Commonly known as:  TUMS - dosed in mg elemental calcium     TAKE these medications   acetaminophen 325 MG tablet Commonly known as:  TYLENOL Take 2 tablets (650 mg total) by mouth every 4 (four) hours as needed (for pain scale < 4).   ibuprofen 600 MG tablet Commonly known as:  ADVIL,MOTRIN Take 1 tablet (600 mg total) by mouth every 6 (six) hours.   prenatal multivitamin Tabs tablet Take 1 tablet by mouth at bedtime.       Diet: routine diet  Activity: Advance as tolerated. Pelvic rest for 6 weeks.   Outpatient follow up:6 weeks Follow up Appt:No future appointments. Follow up Visit:No follow-ups on file.  Postpartum contraception: Undecided  Newborn Data: Live born female  Birth Weight: 6 lb 13 oz (3090 g) APGAR: 5, 9  Newborn Delivery   Birth date/time:  05/22/2018 05:16:00 Delivery type:  Vaginal, Spontaneous     Baby Feeding: Breast Disposition:home with mother   05/24/2018 Oliver Pila, MD

## 2018-05-24 NOTE — Progress Notes (Signed)
Post Partum Day 2 Subjective: no complaints, up ad lib and tolerating PO  Objective: Blood pressure (!) 136/94, pulse 83, temperature 98.6 F (37 C), temperature source Oral, resp. rate 18, height 5\' 8"  (1.727 m), weight 96.2 kg (212 lb), last menstrual period 08/11/2017, SpO2 98 %, unknown if currently breastfeeding.  Physical Exam:  General: alert and cooperative Lochia: appropriate Uterine Fundus: firm   Recent Labs    05/21/18 1717 05/23/18 0532  HGB 14.2 11.5*  HCT 40.9 34.6*    Assessment/Plan: Discharge home   LOS: 3 days   Oliver PilaKathy W Nicco Reaume 05/24/2018, 10:28 AM

## 2018-05-26 ENCOUNTER — Ambulatory Visit: Payer: Self-pay

## 2018-05-26 NOTE — Lactation Note (Signed)
This note was copied from a baby's chart. Lactation Consult  Mother's reason for visit:  *** Visit Type:  *** Appointment Notes:  *** Consult:   Lactation Consultant:  Debera LatMilagros S Ettel Albergo  ________________________________________________________________________  Mom called LC OP phone number because she had a questions about the NS. Baby is 724 days old and she tried to wean baby off the NS yesterday but baby was not able to latch without it. He kept doing the same attempts he did at the hospital without latching. Advised mom to wait a bit longer until BF is well established to attempt to wean baby from NS. Encouraged mom to schedule an OP consultation to have her baby assessed. Mom voiced understanding and she said she already had one scheduled. She may try to reschedule her existing appt. No further questions or concerns reported at this time.  ________________________________________________________________________  Mother's Name: Kelli Perez Type of delivery:  Vaginal, Spontaneous Breastfeeding Experience:  *** Maternal Medical Conditions:   Maternal Medications:  ***  ________________________________________________________________________  Breastfeeding History (Post Discharge)  Frequency of breastfeeding:  *** Duration of feeding:  ***    Infant Intake and Output Assessment  Voids:  *** in 24 hrs.  Color:   Stools:  *** in 24 hrs.  Color:    ________________________________________________________________________  Maternal Breast Assessment  Breast:   Nipple:   Pain level:   Pain interventions:    _______________________________________________________________________ Feeding Assessment/Evaluation  Initial feeding assessment:  Infant's oral assessment:    Positioning:     LATCH documentation:  Latch:    Audible swallowing:    Type of nipple:    Comfort (Breast/Nipple):    Hold (Positioning):    LATCH score:  ***  Attached assessment:    Lips  flanged:    Lips untucked:    Suck assessment:    Tools:   Instructed on use and cleaning of tool:    Pre-feed weight:  *** g  (*** lb. *** oz.) Post-feed weight:  *** g (*** lb. *** oz.) Amount transferred:  *** ml Amount supplemented:  *** ml    Total amount pumped post feed:  R *** ml    L *** ml  Total amount transferred:  *** ml Total supplement given:  *** ml

## 2018-06-03 ENCOUNTER — Ambulatory Visit: Payer: Self-pay

## 2018-06-03 NOTE — Lactation Note (Signed)
This note was copied from a baby's chart. 06/03/2018  Name: Kelli Perez MRN: [id number] Date of Birth: 05/22/2018 Gestational Age: Gestational Age: [redacted]w[redacted]d Birth Weight: 109 oz Weight today:    7 pounds 10.5 ounces (3472 grams) with clean newborn diaper   Kelli Perez presents with mom for feeding assessment.   Kelli Perez has gained 597 grams in the last 10 days with an average daily weight gain of 60 grams a day.   Infant self awakens and feeds about every 2 hours at the breast, mostly using one breast per feeding. Mom reports she is unable to feed infant without the NS in place. She attempts each day without it and he has latched for about 5 minutes with the NS. Mom reports softening of breasts post feeding.   Infant with thick labial frenulum that wraps around the gum ridge. Upper lip stretches pretty well with blanching noted. Infant with thin anterior lingual frenulum noted when he opens his mouth. Infant with good tongue lateralization and extension. He is noted to have some decreased mid tongue elevation. Infant not able to latch without the NS for very long periods, when he does latch on without the NS, nipple is noted to be asymmetrical. Infant clicks on the breast at time. Infant with tongue thrusting and cheek dimpling on the breast and on gloved finger. Infant gaggy when finger inserted in his mouth. Mom reports she and dad both had to have labial frenulums that had to be revised. Discussed with mom how tongue and lip restrictions may effect BF and milk supply.   Reviewed with mom the importance of protecting milk supply while using the NS and until infant evaluated by Oral Specialist and or growth is followed for a while. Mom voiced understanding.   Infant fed on the left breast in the cross cradle hold. He fed actively for about 15 minutes and transferred 62 ml. Infant satisfied post feeding. Infant with some cheek dimpling at the breast. We did attempt without the NS and he was not able to  maintain latch and nipple was asymmetrical post latch.   Reviewed how to know infant is getting enough. Reviewed storing milk only after infant has been fed.   Infant to follow up with Dr. Chestine Spore on Monday August 5. [facility] has [facility] by [facility] with no plan to follow up . Mom aware of BF Support Groups at [facility]. Follow up with Lactation in 2 weeks.or 1-2 days post release if completed  Mom reports all questions/concerns have been answered at this time. Mom to call with any questions/concerns as needed.     General Information: Mother's reason for visit: feeding assessment Consult: Initial Lactation consultant: Noralee Stain RN,IBCLC Breastfeeding experience: going well, unable to feed without the NS   Maternal medications: Pre-natal vitamin, Motrin (ibuprofen)  Breastfeeding History: Frequency of breast feeding: every 2 hours Duration of feeding: 10-15 minutes, mostly one side per feeding  Supplementation: Supplement method: bottle(Nuk Natural)         Breast milk volume: 2-3 ounces Breast milk frequency: 1 x a day when dad is at home in the evening Total breast milk volume per day: 2-3 ounces Pump type: Medela pump in style Pump frequency: 2 x a day, 10 minutes Pump volume: 6 ounces in the AM   Infant Output Assessment: Voids per 24 hours: 10 Urine color: Clear yellow Stools per 24 hours: 6-7 Stool color: Yellow  Breast Assessment: Breast: Filling, Compressible Nipple: Erect Pain level: 1(mostly with pumping in  the morning) Pain interventions: Bra  Feeding Assessment: Infant oral assessment: Variance Infant oral assessment comment: Infant with thick labial frenulum that wraps around the gum ridge. Upper lip stretches pretty well with blanching noted. Infant with thin anterior lingual frenulum noted when he opens his mouth. Infant with good tongue lateralization and extension. He is noted to have some decreased mid tongue elevation. Infant not able to latch  without the NS for very long periods, when he does latch on without the NS, nipple is noted to be asymmetrical. Infant clicks on the breast at time. Infant with tongue thrusting and cheek dimpling on the breast and on gloved finger. Infant gaggy when finger inserted in his mouth. Positioning: Cross cradle(left breast) Latch: 1 - Repeated attempts needed to sustain latch, nipple held in mouth throughout feeding, stimulation needed to elicit sucking reflex. Audible swallowing: 2 - Spontaneous and intermittent Type of nipple: 2 - Everted at rest and after stimulation Comfort: 2 - Soft/non-tender Hold: 2 - No assistance needed to correctly position infant at breast LATCH score: 9 Latch assessment: Deep Lips flanged: Yes Suck assessment: Displays both Tools: Nipple shield 24 mm Pre-feed weight: 3472 grams Post feed weight: 3544 grams Amount transferred: 72 ml Amount supplemented: 0  Additional Feeding Assessment:                                    Totals: Total amount transferred: 72 ml Total supplement given: 0 Total amount pumped post feed: 0   Plan: 1. Offer infant the breast with feeding cues, offer both breasts with each feeding if he wants it 2. Use the # 24 nipple Shield as needed to keep infant latched 3. Try each day without the Nipple Shield to see when infant is able to feed without it 4. Keep infant awake at the breast with feedings 5. Massage/compress breast with feedings 6. Continue pumping 2-4 x a day with double electric breast pump post breast feeding for 10-15 minutes or when infant receiving a bottle to protect milk supply for as long as infant using the Nipple Shield  7. Infant needs 64-85 ml (2-2.5 Ounces) for 8 feedings a day or 510-680 ml (17-23 ounces) in 24 hours. Infant may want more or less depending on how often he feeds 8. When feeding infant a bottle use the paced bottle feeding method ([email or web address].[email or web address]) 9. Consider having infant evaluated by  Oral Specialist 10. Keep up the good work 11. Call for questions/cocnerns as needed [phone] 12. Thank you for allowing me to assist you today 13. Follow up with Lactation in 2 weeks or 1-2 days post release post release  Ed BlalockSharon S Chapel Silverthorn RN, IBCLC                                                    Silas FloodSharon S Azilee Pirro 06/03/2018, 3:43 PM

## 2018-06-17 ENCOUNTER — Ambulatory Visit: Payer: Self-pay

## 2018-06-17 NOTE — Lactation Note (Signed)
This note was copied from a baby's chart. 06/17/2018  Name: Kelli Perez MRN: [id number] Date of Birth: 05/22/2018 Gestational Age: Gestational Age: 2984w4d Birth Weight: 109 oz Weight today:    9 pounds 5 ounces (4226 grams) with clean newborn diaper  Kelli Perez presents today with mom for follow up feeding assessment. Infant post tongue/lip tie release on 8/13 by Dr. Orland MustardMcMurtry.   Infant has gained 754 grams in the last 14 days with an average daily weight gain of 54 grams a day.   Mom reports infant has been feeding every 1-2 hours, suspect growth spurt as infant is gaining well. Infant self awakens to feed and takes 1-2 breasts per feeding. Infant is still needing to use the NS, otherwise he will not latch and feed. Enc mom to use as needed and to work on weaning off by attempting to feed without it daily. Mom reports he fed last night without it.   Mom is pumping once a day and getting about 3 ounces. She is storing the milk. She reports they have not been giving infant bottles. Enc mom to reintroduce bottles about 4-6 weeks and give one a day or every 2-3 days to keep him familiar with them if parents want. Mom is not planning to return to work.   Mom reports infant fussy last night and is better today. Mom reports they are performing stretches as prescribed. Mom aware that it may take infant a few weeks to improve tongue function completely. Mom was shown suck training exercises to perform until tongue and lip heals.   Mom latched infant to the left breast in the cross cradle hold with the # 24 NS in place. Infant latched and fed well. Infant with gulping noted. Infant tolerated feeding well. Nipple pulls up well into the NS. Mom denies pain with feeding. Infant transferred 54 ml and self detached. Infant then latched to the right breast in the cross cradle hold and nursed again for about 15 minutes and transferred an additional 22 ml. Infant satisfied post feeding.   Infant noted to have high  pitched crowing sound with inhalation. It was more noticeable at the beginning of the feeding with gulping noted. Mom has noticed sound previously. Mom to inform pediatrician if if continues.   Infant to follow up with Pediatrician at 1 month. Mom aware of BF Support Groups. Infant to follow up with Lactation in 2 weeks at mom's request. Infant to follow up with Dr. Orland MustardMcMurtry in 2 weeks.   Mom reports all questions/concerns have been answered. Mom to call with questions/concerns prn.    General Information: Mother's reason for visit: follow up feeding assessment, post tongue/Lip tie release Consult: Follow-up Lactation consultant: [name] RN,IBCLC Breastfeeding experience: BF better, eating frequently, stopped bottle supplementation, still needs NS   Maternal medications: Pre-natal vitamin, Motrin (ibuprofen), Stool softener(Motrin and stool softener prn)  Breastfeeding History: Frequency of breast feeding: every 1-2 hours Duration of feeding: 5-20 minutes  Supplementation: Supplement method: bottle(Nuk Nipple, not giving supplement at this time)               Pump type: Medela pump in style Pump frequency: 1 x a day, 10 minutes Pump volume: 3 ounces  Infant Output Assessment: Voids per 24 hours: 10  Urine color: Clear yellow Stools per 24 hours: 4+ plus smears Stool color: Yellow  Breast Assessment: Breast: Filling, Compressible Nipple: Erect Pain level: 0 Pain interventions: Bra  Feeding Assessment:   Infant oral assessment comment: infant  with granulation tissue to upper lip. Upper lip flanges well. Tongue with diamond shaped granulation tissue. Tongue with much better mobility noted today. infant tender wtih manual manipulation today. His suck was decreased today, possible because of tenderness. infant less gaggy today, cheek dimpling better, no clicking noted wtih feeding, infant still needs NS for feeding.  Positioning: Cross cradle(left breast) Latch: 2 -  Grasps breast easily, tongue down, lips flanged, rhythmical sucking. Audible swallowing: 2 - Spontaneous and intermittent Type of nipple: 2 - Everted at rest and after stimulation Comfort: 2 - Soft/non-tender Hold: 2 - No assistance needed to correctly position infant at breast LATCH score: 10 Latch assessment: Deep Lips flanged: Yes Suck assessment: Displays both Tools: Nipple shield 24 mm Pre-feed weight: 4226 grams Post feed weight: 4280 grams Amount transferred: 54 ml Amount supplemented: 0  Additional Feeding Assessment: Infant oral assessment: Variance Infant oral assessment comment: see above Positioning: Cross cradle(right breast) Latch: 1 - Repeated attempts neede to sustain latch, nipple held in mouth throughout feeding, stimulation needed to elicit sucking reflex. Audible swallowing: 2 - Spontaneous and intermittent Type of nipple: 2 - Everted at rest and after stimulation Comfort: 2 - Soft/non-tender Hold: 2 - No assistance needed to correctly position infant at breast LATCH score: 9 Latch assessment: Deep Lips flanged: Yes Suck assessment: Displays both Tools: Nipple shield 24 mm Pre-feed weight: 4280 grams Post feed weight: 4302 grams Amount transferred: 22 ml Amount supplemented: 0  Totals: Total amount transferred: 76 ml Total supplement given: 0 Total amount pumped post feed: ddi not pump   Plan: 1. Offer infant the breast with feeding cues, offer both breasts with each feeding if he wants it 2. Use the # 24 nipple Shield as needed to keep infant latched 3. Try each day without the Nipple Shield to see when infant is able to feed without it, try when infant not angry and in the middle of a feeding.  4. Keep infant awake at the breast with feedings 5. Massage/compress breast with feedings 6. Continue pumping 2-4 x a day with double electric breast pump post breast feeding for 10-15 minutes or when infant receiving a bottle to protect milk supply for as  long as infant using the Nipple Shield  7. Infant needs 79-105  ml (2.5-3.5 Ounces) for 8 feedings a day or 630-840 ml (21-28 ounces) in 24 hours. Infant may want more or less depending on how often he feeds 8. When feeding infant a bottle use the paced bottle feeding method ([email or web address].[email or web address]) 9. Continue stretches per Dr. Orland MustardMcMurtry until tongue and lip are healed 10. Start suck training exercises tomorrow 5-6 x a day, 1-2 minutes each exercise for 2-3 weeks until tongue and lip are healed 11. Keep up the good work 12. Call for questions/concerns as needed [phone] 13. Thank you for allowing me to assist you today 14. Follow up with Lactation in 2 weeks  Ed BlalockSharon S Kristian Mogg RN, IBCLC                                                      Ed BlalockSharon S Marciel Offenberger 06/17/2018, 3:45 PM

## 2018-07-02 ENCOUNTER — Ambulatory Visit: Payer: Self-pay

## 2018-07-02 NOTE — Lactation Note (Signed)
This note was copied from a baby's chart. 07/02/2018  Name: Kelli Perez MRN: [id number] Date of Birth: 05/22/2018 Gestational Age: Gestational Age: 2752w4d Birth Weight: 109 oz Weight today:    10 pounds 9.8 ounces (4816 grams) with clean size 1  Diaper  Kelli Perez presents today with mom for follow up feeding assessment. Mom feels BF is still difficult at times.   Mom reports infant feeds every 1-4 hours and has decreased his feeding time to about 10 minutes per feeding. Infant will not latch without the NS and will not latch to the right breast well. Enc mom to keep trying the right breast and to latch without the NS. Try when sleepy, at beginning of the feeding and in the middle of the feeding.   Mom is pumping once a day, reviewed to maintain milk supply in the right breast it is recommended that she pump it the right breast 6-8 x a day. Mom is having difficulty getting time to pump. She has noticed her right breast has decreased in size to about 1/2 of the other.   Mom reports she keeps infant upright after feeding and he is not spitting. Infant burps well.   Infant has been fussy the last few days per mom and not wanting to nap, mom tearful today. Infant calmed after feeding.   Infant with stridor on inhalation that is increased since last visit, mom reports ped is aware and is following. Stridor stopped after feeding.   Infant latched to the left breast in the cradle hold with the # 24 nipple shield. Infant fussy with feeding and stridor increases at times. Infant transferred 128 ml in about 15 minutes. We then tried to relatch without the NS and he was not able to sustain latch without the NS. Infant satisfied post feeding.   Infant to follow up with Dr. Chestine Sporelark on Sept. 20. Mom aware of BF Support Groups. Infant to follow up with Lactation as needed. Mom to call with questions/concerns and follow up as needed.  Mom feels she has good support at home. She is aware she can call her OB if  she needs emotional support. Mom reports today has been the hardest day in a long time. Enc her to try and take time for herself.   General Information: Mother's reason for visit: Follow up feeding assessment Consult: Follow-up Lactation consultant: Noralee StainSharon Hice RN,IBCLC Breastfeeding experience: Sometimes difficult, is eating for shorter periods of time   Maternal medications: Pre-natal vitamin  Breastfeeding History: Frequency of breast feeding: every 1-4 hours Duration of feeding: 10 minutes usually  Supplementation: Supplement method: bottle         Breast milk volume: 2-3 ounces Breast milk frequency: 1 x a week   Pump type: Medela pump in style Pump frequency: 1 x a day Pump volume: 2 ounces  Infant Output Assessment: Voids per 24 hours: 10 Urine color: Clear yellow Stools per 24 hours: 2-3 Stool color: Yellow  Breast Assessment: Breast: Filling, Compressible Nipple: Erect Pain level: 1(with occasional latch and then approves) Pain interventions: Bra  Feeding Assessment: Infant oral assessment: Variance Infant oral assessment comment: tongue and lip have healed, infant will not latch without the nipple shield.  Positioning: Cross cradle(left breast) Latch: 2 - Grasps breast easily, tongue down, lips flanged, rhythmical sucking. Audible swallowing: 2 - Spontaneous and intermittent Type of nipple: 2 - Everted at rest and after stimulation Comfort: 2 - Soft/non-tender Hold: 2 - No assistance needed to correctly position infant at  breast LATCH score: 10 Latch assessment: Deep Lips flanged: Yes Suck assessment: Displays both Tools: Nipple shield 24 mm Pre-feed weight: 4816 grams Post feed weight: 4944 grams Amount transferred: 110 ml Amount supplemented: 0  Additional Feeding Assessment:                                    Totals: Total amount transferred: 128 ml Total supplement given: 0 Total amount pumped post feed: did not pump    Plan:  1. Offer infant the breast with feeding cues, offer both breasts with each feeding if he wants it 2. Use the # 24 nipple Shield as needed to keep infant latched 3. Try each day without the Nipple Shield to see when infant is able to feed without it, try when infant not angry and in the middle of a feeding.  4. Continue pumping 2-4 x a day with double electric breast pump post breast feeding for 10-15 minutes or when infant receiving a bottle to protect milk supply for as long as infant using the Nipple Shield . Would recommend you pump the right breast until infant is ready to latch 5. Infant needs 90-120 ml (3-4 Ounces) for 8 feedings a day or 720-960 ml (24-32 ounces) in 24 hours. Infant may want more or less depending on how often he feeds 6. When feeding infant a bottle use the paced bottle feeding method ([email or web address]) 7. Keep up the good work 8. Call for questions/concerns as needed [phone] 9. Thank you for allowing me to assist you today 10. Follow up with Lactation as needed   Ed Blalock RN, [employer]

## 2018-08-03 ENCOUNTER — Telehealth (HOSPITAL_COMMUNITY): Payer: Self-pay | Admitting: Lactation Services

## 2018-08-03 NOTE — Telephone Encounter (Signed)
Outgoing Telephone call : - LC returning call for this mom and obtained her answering machine/ left message to call the [facility] services number [phone].

## 2018-08-11 ENCOUNTER — Ambulatory Visit: Payer: Self-pay

## 2018-08-11 NOTE — Lactation Note (Addendum)
This note was copied from a baby's chart. 08/11/2018  Name: Kelli Perez MRN: [id number] Date of Birth: 05/22/2018 Gestational Age: Gestational Age: [redacted]w[redacted]d Birth Weight: 109 oz Weight today:    12 pounds 15.7 ounces (5888 grams) with clean size 1 diaper  Infant presents today with mom for feeding assessment. Mom reports infant has been refusing to nurse or nursing for short periods of time over the last 5-7 days. . Infant awake and alert, he is smiling and cooing. He seems distracted by nursing. Infant with periods of fussiness and would get upset when placed in nursing position. Infant with some rigidity and arching when upset. Mucous membranes are moist and infant voiding large wet diapers.   Infant has gained 1072 grams in the last 40 days with an average daily weight gain of 27 grams a day. Weight gain is within normal limits.   Mom reports infant weaned off the NS 2 weeks ago. He currently will not latch with it. Mom reports infant will nurse better at night and not much during the day. He is having good wet diapers and no stool in 3 days. Mom reports infant was stuffy a few days ago and they suctions some clear secretions from his nose. He has not had signs of illness or fever. Mom has been trying bicycling legs and tummy time with infant. Last stool was a huge runny breast milk looking stool per mom. Abdomen non distended.   Infant typically only wants to feed on the right breast, mom has gotten him to recently latch to the left breast. infant tens to gag on the right breast. When mom is pumping she only gets 1 ounce. She gets very little out on the right breast. She reports she pumped 4 ounces out of the left breast a few days ago. Mom reports he prefers to lay in the side lying position to eat.   Parents have tried feeding infant with a bottle and he is refusing and just biting/chewing on the bottle. Mom using Avent Natural Flow and NUK nipples, infant has refused both.   Mom is feeding  in quiet dark places since infant is distracted. Infant fighting naps per mom. Mom reports infant is a fussy baby.   Infant latched and fed off both breasts. He did pull on and off the breasts at times to talk and smile at mom. Infant was noted to choke some on the breast with feeding and would pull off. He fed better on the left breast vs the right breast. Infant was evaluated by Ped on Friday and did not have any issues at that time.   We tried cup feeding, feeding tube at the breast, bottle nipple, and curved tip syringe with finger feeding and infant would not suckle, he was chewing on every thing in his mouth. Infant not suckling on gloved finger today with some tongue thrusting noted when object placed in mouth. He allows liquid to run back out of his mouth.   Enc mom to protect her milk supply and to offer infant supplement by Dr Theora Gianotti Level 1 nipple or preemie nipple if choking or drooling on the Level 1 nipple. Mom has Dr. Theora Gianotti bottles at home. Enc STS and nursing vacation with infant. Enc mom to ask Ped about being evaluated by PT for feeding assessment. Mom to call Ped.   Infant to follow up with Ped at 4 months. Infant to follow up with Lactation in 1 week for weight check. Mom to call  with questions/concerns as needed.   Addendum: Spoke with Allie Dimmer, PT  on 10/9 @ 08:15 who recommended PT evaluation based on what is happening. LC will contact Peds office to request. This morning.     General Information: Mother's reason for visit: Feeding difficulty in the last week Consult: Follow-up Lactation consultant: Noralee Stain RN,IBCLC Breastfeeding experience: eating for very short times, pulling on and off   Maternal medications: Pre-natal vitamin  Breastfeeding History: Frequency of breast feeding: every 2 hours Duration of feeding: 2-10 minutes  Supplementation: Supplement method: bottle(will not take, Nuk and Avent nipples)               Pump type: Medela pump  in style Pump frequency: 1-2 x a day Pump volume: 1/2-4 ounces  Infant Output Assessment: Voids per 24 hours: 10 Urine color: Clear yellow Stools per 24 hours: none in 3 days Stool color: Yellow  Breast Assessment: Breast: Soft, Compressible Nipple: Erect Pain level: 0 Pain interventions: Bra  Feeding Assessment: Infant oral assessment: Variance Infant oral assessment comment: oral mobility has improved. infant with high palate, infant will not stay latched and will not take a bottle nipple Positioning: Cross cradle(left and right breasts for < 2 minutes each) Latch: 1 - Repeated attempts needed to sustain latch, nipple held in mouth throughout feeding, stimulation needed to elicit sucking reflex. Audible swallowing: 1 - A few with stimulation Type of nipple: 2 - Everted at rest and after stimulation Comfort: 2 - Soft/non-tender Hold: 2 - No assistance needed to correctly position infant at breast LATCH score: 8 Latch assessment: Deep Lips flanged: Yes Suck assessment: Displays both   Pre-feed weight: 5888 grams Post feed weight: 5906 grams Amount transferred: 18 ml Amount supplemented: 0. would not take  Additional Feeding Assessment:                                    Totals: Total amount transferred: 18 ml Total supplement given: would not take Total amount pumped post feed: 1.5 ounces   Plan:  1. Offer infant breast with feeding cues, try to offer breast when infant is sleepy and drowsy 2. Practice Skin to Skin with infant as much as possible, try baby wearing when possible 3. Offer breast milk in a bottle if infant will take it, try the Dr. Theora Gianotti Level 1 nipple for feeding, change to a Dr. Theora Gianotti Preemie if choking or drooling on the Level 1 nipple 4. Can try a sippy cup with milk in it also 5. Protect milk supply when infant not willing to latch by pumping with your double electric breast pump. Would recommend pumping 4-6 x a day 6. Feed  infant in a quiet dark environment when possible 7. Take a nursing Vacation with infant for a few days 8. Consider having infant evaluated by Everardo Beals PT at [facility], will most likely need a referral from your pediatrician 9. If feedings do not improve in the next few days, please call and have infant evaluated by Pediatrician 10. Restart suck training exercises as before.  11. Keep up the good work 12. Thank you for allowing me to assist you today 13. Please call with any questions/concerns as needed 14. Follow up with Lactation in 1 week   Ed Blalock RN, [employer]  Silas Flood Raschelle Wisenbaker 08/11/2018, 2:25 PM

## 2018-08-20 ENCOUNTER — Ambulatory Visit: Payer: Self-pay

## 2018-08-20 NOTE — Lactation Note (Signed)
This note was copied from a baby's chart. Lactation Consultation Note  Patient Name: Kelli Perez Today's Date: 08/20/2018     Maternal Data    Feeding    LATCH Score                   Interventions    Lactation Tools Discussed/Used     Consult Status      Ed Blalock 08/20/2018, 11:37 AM

## 2018-09-03 ENCOUNTER — Ambulatory Visit: Payer: Self-pay

## 2018-09-03 NOTE — Lactation Note (Signed)
This note was copied from a baby's chart.  09/03/2018  Name: Philamena Kramar MRN: [id number] Date of Birth: 05/22/2018 Gestational Age: Gestational Age: [redacted]w[redacted]d Birth Weight: 109 oz Weight today:    13 pounds 13.3 ounce (6274 grams) with clean size 1 diaper and onesie  Infant reports today with mom for follow up feeding assessment. Mom reports infant has started on Zantac on 10/10 and mom reports overall he is better. Mom hears some gurgling infant infant throat at times and infant does spit some into his mouth. She reports she cal lay him down more now.   Infant less tense with less stiffening noted today. Infant pleasant most of the time and was laughing and smiling and cooing. Infant was inconsolable at the end of the consult and crying. Mom attempted feeding and comfort measures.   Infant has gained 386 grams in the last 23 days with an average daily weight gain of 17 grams a day.   Mom reports infant is being offered a bottle once a day at least by mom and dad and has tried to the Nuk Natural that he used to take and will not take currently. He will also not use the NS and did not do well with the Comotomo bottles.   Infant feeds about every 2 hours for about 7-8 a side. He will eat a little longer at night.  Mom is doing occasional pumping when she can find time.   Infant did latch on both breasts and fed well for short bursts. Infant easily distracted. Infant feeds better at night per mom. She is taking him into quiet room to feed at times.   Mom drinks plenty of fluids and 1 cup of coffee a day. Mom only taking PNV.   Infant does seem to get overwhelmed with the flow on the left breast. He is noted to have a stridorous sound on inhalation when feeding mostly, mom reports Dr. Chestine Spore is aware.  Discussed mom keeping feeding log paying attention to acidic foods, dairy, and caffeine.  Discussed infant with Dr. Chestine Spore yesterday about concerns. Dr. Chestine Spore aware that we have a SLP that  would be available to see Madelin Rear to rule out oral issues. Mom aware also. Mom reports infant is doing better. Mom has a neighbor going through the same issues that has helped mom. mom is walking with her friend when they can, she does not go out with infant often.  Infant to follow up with Dr. Chestine Spore at 80 months of age. She has an appt tomorrow with him but may cancel it since she has talked with Dr. Chestine Spore since. Infant to follow up with Lactation as needed. Mom plans to attend BF Support Groups.   General Information: Mother's reason for visit: Follow up feeding assessment Consult: Follow-up Lactation consultant: Noralee Stain RN,IBCLC Breastfeeding experience: eating short periods of time, pulling on and off, has improved  since starting Zantac on 10/10   Maternal medications: Pre-natal vitamin  Breastfeeding History: Frequency of breast feeding: every 2 hours Duration of feeding: 5-8 minutes  Supplementation: Supplement method: bottle(Nuk, Comotomo)               Pump type: Medela pump in style Pump frequency: 1 x a day Pump volume: 1-2 ounces  Infant Output Assessment: Voids per 24 hours: 10  Urine color: Clear yellow Stools per 24 hours: every 4 days Stool color: Yellow  Breast Assessment: Breast: Soft, Compressible Nipple: Erect Pain level: 0 Pain interventions: Bra  Feeding  Assessment: Infant oral assessment: Variance Infant oral assessment comment: infant with high palate. infant with tongue thrusting when finger placed in mouth Positioning: Cross cradle(left and right breast) Latch: 1 - Repeated attempts needed to sustain latch, nipple held in mouth throughout feeding, stimulation needed to elicit sucking reflex. Audible swallowing: 1 - A few with stimulation Type of nipple: 2 - Everted at rest and after stimulation Comfort: 2 - Soft/non-tender Hold: 2 - No assistance needed to correctly position infant at breast LATCH score: 8 Latch assessment: Deep Lips  flanged: Yes Suck assessment: Displays both   Pre-feed weight: 6274 grams Post feed weight: 6324 grams Amount transferred: 50 ml + ate a little longer at the end of assessment Amount supplemented: 0  Additional Feeding Assessment:                                    Totals: Total amount transferred: 50 Total supplement given: 0 Total amount pumped post feed: did not pump   Engineering geologist, IBCLC  Plan:  1. Offer infant breast with feeding cues 2. Practice Skin to Skin with infant as much as possible, try baby wearing when possible 3. Have someone else feed him the bottles, may have to try other kinds. Slow flow would be best for him 4. Can try a sippy cup with milk in it also 5. Feed infant in a quiet dark environment when possible 6. Take a nursing Vacation with infant for a few days 7. Keep up the good work 8. Thank you for allowing me to assist you today 9. Please call with any questions/concerns as needed 10. Follow up with Lactation as needed     Ed Blalock 09/03/2018, 11:53 AM

## 2018-09-10 ENCOUNTER — Telehealth: Payer: Self-pay | Admitting: *Deleted

## 2018-09-10 ENCOUNTER — Telehealth (HOSPITAL_COMMUNITY): Payer: Self-pay | Admitting: Lactation Services

## 2018-09-10 NOTE — Telephone Encounter (Signed)
Mom called and feels like she has a sinus infection. Mom plans to call MD to inquire about taking a Zpack. Read mom information from Bobbye Morton, Medications and Mother's Milk 2019 that Azithromycin in considered an L2 (limited data-Probably compatible with no reported pediatric concerns via breasts milk.  Mom voiced understanding. Mom to call back with further questions/cocnerns as needed.

## 2018-09-10 NOTE — Telephone Encounter (Signed)
Aundra Millet called and left a voicemail.She states she has brought her son here a few times. States she thinks she has a sinus infection and wants to know if it is ok to take a zpack or antibiotic. Per chart review she is not a patient in this clinic. Will forward to Advertising copywriter.

## 2019-05-24 ENCOUNTER — Telehealth (HOSPITAL_COMMUNITY): Payer: Self-pay | Admitting: Lactation Services

## 2019-05-24 NOTE — Telephone Encounter (Signed)
Kelli Perez called regarding a recurrent plugged duct on one nipple.  Recently she underwent treatment for mastitis, but is still having pain on this one nipple.  Kelli Perez can see the end of the plug.  She has been using epson salt soaks and olive oil.  Kelli Perez denies red streaks or pain in her breasts.  Recommended- -Warm, moist soaks to her breast on affected side, submerging breast in bowl of warm water while massaging. -Massage prior to breast feeding, changing positions to help drain all the ducts -Lecithin supplement (Kelli Perez has just ordered some on Yoe) - To call again if signs and symptoms of Mastitis or abscess in her breast.  Fever, reddened and warm area on breast, headache and flu-like symptoms.

## 2019-05-31 ENCOUNTER — Telehealth (HOSPITAL_COMMUNITY): Payer: Self-pay

## 2019-05-31 NOTE — Telephone Encounter (Signed)
Mother phoned to inform that Mastitis from 3 weeks ago has resolved. She now has what she calls a Milk Blister on the underside of her nipple. Mother describes that she has a white round dot on her nipple.  She reports that she is taking Sunflower lesithin. She reports that it is some better. She reports that it is some bothersome. She denies minimal pain. She denies engorgement . She reports that her breast does feel hot at times.  She phone Dr Melba Coon and Dr Melba Coon recommended that she phone [facility] office .  Mother was given suggestions on resolving milk bleb.   Advised mother to change infants positions and alternate breast frequently. Suggested side lying.  Suggested that she do gentle massage behind the nipple with olive oil, maybe while in the shower and in an upward motion.  Advised mother to look in mirror to see if there is any redness  Suggested that mother continue to take lesitin . Mother to try warm compress. Suggested that mother follow up with [facility] outpatient if unable to resolve and continued pain. Mother may also discuss again with Dr Melba Coon.

## 2019-10-14 ENCOUNTER — Other Ambulatory Visit: Payer: Self-pay

## 2019-10-14 DIAGNOSIS — Z20822 Contact with and (suspected) exposure to covid-19: Secondary | ICD-10-CM

## 2019-10-16 LAB — NOVEL CORONAVIRUS, NAA: SARS-CoV-2, NAA: NOT DETECTED

## 2020-04-11 DIAGNOSIS — O99342 Other mental disorders complicating pregnancy, second trimester: Secondary | ICD-10-CM | POA: Insufficient documentation

## 2020-04-11 LAB — OB RESULTS CONSOLE GC/CHLAMYDIA
Chlamydia: NEGATIVE
Gonorrhea: NEGATIVE

## 2020-04-11 LAB — OB RESULTS CONSOLE HEPATITIS B SURFACE ANTIGEN: Hepatitis B Surface Ag: NEGATIVE

## 2020-04-11 LAB — OB RESULTS CONSOLE VARICELLA ZOSTER ANTIBODY, IGG: Varicella: IMMUNE

## 2020-04-11 LAB — OB RESULTS CONSOLE HIV ANTIBODY (ROUTINE TESTING): HIV: NONREACTIVE

## 2020-04-11 LAB — OB RESULTS CONSOLE RUBELLA ANTIBODY, IGM: Rubella: IMMUNE

## 2020-07-31 DIAGNOSIS — Z20822 Contact with and (suspected) exposure to covid-19: Secondary | ICD-10-CM | POA: Insufficient documentation

## 2020-11-04 NOTE — L&D Delivery Note (Signed)
Operative Delivery Note At 1:57 PM a viable and healthy female was delivered via Vaginal, Spontaneous.  Presentation: vertex; Position: Right,, Occiput,, Anterior; Station: +4.  Delivery of the head: spontaneous  ,   Second maneuver:  Woods screw Third maneuver: ,  Mcroberts Fourth maneuver: ,  Suprapubic pressure Fifth maneuver: ,  Sweep post arm across body to decompress shoulder diameter Total time 45sec   APGAR: 7, 9; weight pending.   Placenta status: spontaneous, intact.   Cord:  with the following complications: none.  Cord pH: na  Anesthesia:  epidural Episiotomy: None Lacerations: 1st degree Suture Repair: 3.0 vicryl Est. Blood Loss (mL):  200  Mom to postpartum.  Baby to Couplet care / Skin to Skin.  Joyell Emami J 11/09/2020, 2:19 PM

## 2020-11-09 ENCOUNTER — Inpatient Hospital Stay (HOSPITAL_COMMUNITY): Payer: BC Managed Care – PPO | Admitting: Anesthesiology

## 2020-11-09 ENCOUNTER — Encounter (HOSPITAL_COMMUNITY): Payer: Self-pay | Admitting: Obstetrics and Gynecology

## 2020-11-09 ENCOUNTER — Inpatient Hospital Stay (HOSPITAL_COMMUNITY)
Admission: AD | Admit: 2020-11-09 | Discharge: 2020-11-10 | DRG: 807 | Disposition: A | Discharge status: Home / Self Care | Payer: BC Managed Care – PPO | Attending: Obstetrics and Gynecology | Admitting: Obstetrics and Gynecology

## 2020-11-09 ENCOUNTER — Other Ambulatory Visit: Payer: Self-pay

## 2020-11-09 DIAGNOSIS — O99824 Streptococcus B carrier state complicating childbirth: Principal | ICD-10-CM | POA: Diagnosis present

## 2020-11-09 DIAGNOSIS — O26893 Other specified pregnancy related conditions, third trimester: Secondary | ICD-10-CM | POA: Diagnosis present

## 2020-11-09 DIAGNOSIS — Z20822 Contact with and (suspected) exposure to covid-19: Secondary | ICD-10-CM | POA: Diagnosis present

## 2020-11-09 DIAGNOSIS — Z3A39 39 weeks gestation of pregnancy: Secondary | ICD-10-CM | POA: Diagnosis not present

## 2020-11-09 DIAGNOSIS — F419 Anxiety disorder, unspecified: Secondary | ICD-10-CM | POA: Diagnosis present

## 2020-11-09 DIAGNOSIS — O99344 Other mental disorders complicating childbirth: Secondary | ICD-10-CM | POA: Diagnosis present

## 2020-11-09 LAB — URINALYSIS, ROUTINE W REFLEX MICROSCOPIC
Bilirubin Urine: NEGATIVE
Glucose, UA: NEGATIVE mg/dL
Ketones, ur: NEGATIVE mg/dL
Nitrite: NEGATIVE
Protein, ur: NEGATIVE mg/dL
Specific Gravity, Urine: 1.006 (ref 1.005–1.030)
pH: 6 (ref 5.0–8.0)

## 2020-11-09 LAB — CBC
HCT: 42.1 % (ref 36.0–46.0)
Hemoglobin: 13.8 g/dL (ref 12.0–15.0)
MCH: 28.9 pg (ref 26.0–34.0)
MCHC: 32.8 g/dL (ref 30.0–36.0)
MCV: 88.1 fL (ref 80.0–100.0)
Platelets: 266 10*3/uL (ref 150–400)
RBC: 4.78 MIL/uL (ref 3.87–5.11)
RDW: 13.4 % (ref 11.5–15.5)
WBC: 13.4 10*3/uL — ABNORMAL HIGH (ref 4.0–10.5)
nRBC: 0 % (ref 0.0–0.2)

## 2020-11-09 LAB — RESP PANEL BY RT-PCR (FLU A&B, COVID) ARPGX2
Influenza A by PCR: NEGATIVE
Influenza B by PCR: NEGATIVE
SARS Coronavirus 2 by RT PCR: NEGATIVE

## 2020-11-09 LAB — OB RESULTS CONSOLE GBS: GBS: POSITIVE

## 2020-11-09 LAB — RPR: RPR Ser Ql: NONREACTIVE

## 2020-11-09 LAB — TYPE AND SCREEN
ABO/RH(D): A POS
Antibody Screen: NEGATIVE

## 2020-11-09 MED ORDER — LACTATED RINGERS IV SOLN: INTRAVENOUS | Status: DC

## 2020-11-09 MED ORDER — SOD CITRATE-CITRIC ACID 500-334 MG/5ML PO SOLN: 30.0000 mL | ORAL | Status: DC | PRN

## 2020-11-09 MED ORDER — OXYCODONE-ACETAMINOPHEN 5-325 MG PO TABS: 1.0000 | ORAL_TABLET | ORAL | Status: DC | PRN

## 2020-11-09 MED ORDER — OXYTOCIN BOLUS FROM INFUSION
333.0000 mL | Freq: Once | INTRAVENOUS | Status: AC
  Administered 2020-11-09: 333 mL via INTRAVENOUS

## 2020-11-09 MED ORDER — ZOLPIDEM TARTRATE 5 MG PO TABS: 5.0000 mg | ORAL_TABLET | Freq: Every evening | ORAL | Status: DC | PRN

## 2020-11-09 MED ORDER — ONDANSETRON HCL 4 MG/2ML IJ SOLN: 4.0000 mg | INTRAMUSCULAR | Status: DC | PRN

## 2020-11-09 MED ORDER — METHYLERGONOVINE MALEATE 0.2 MG/ML IJ SOLN: 0.2000 mg | INTRAMUSCULAR | Status: DC | PRN

## 2020-11-09 MED ORDER — PHENYLEPHRINE 40 MCG/ML (10ML) SYRINGE FOR IV PUSH (FOR BLOOD PRESSURE SUPPORT)
80.0000 ug | PREFILLED_SYRINGE | INTRAVENOUS | Status: DC | PRN
  Filled 2020-11-09: qty 10

## 2020-11-09 MED ORDER — EPHEDRINE 5 MG/ML INJ: 10.0000 mg | INTRAVENOUS | Status: DC | PRN

## 2020-11-09 MED ORDER — DIBUCAINE (PERIANAL) 1 % EX OINT: 1.0000 "application " | TOPICAL_OINTMENT | CUTANEOUS | Status: DC | PRN

## 2020-11-09 MED ORDER — METHYLERGONOVINE MALEATE 0.2 MG PO TABS: 0.2000 mg | ORAL_TABLET | ORAL | Status: DC | PRN

## 2020-11-09 MED ORDER — LACTATED RINGERS IV SOLN
500.0000 mL | INTRAVENOUS | Status: DC | PRN
  Administered 2020-11-09: 1000 mL via INTRAVENOUS

## 2020-11-09 MED ORDER — DIPHENHYDRAMINE HCL 50 MG/ML IJ SOLN: 12.5000 mg | Freq: Four times a day (QID) | INTRAMUSCULAR | Status: DC | PRN

## 2020-11-09 MED ORDER — ONDANSETRON HCL 4 MG/2ML IJ SOLN: 4.0000 mg | Freq: Four times a day (QID) | INTRAMUSCULAR | Status: DC | PRN

## 2020-11-09 MED ORDER — SODIUM CHLORIDE (PF) 0.9 % IJ SOLN
INTRAMUSCULAR | Status: DC | PRN
  Administered 2020-11-09: 12 mL/h via EPIDURAL

## 2020-11-09 MED ORDER — FLEET ENEMA 7-19 GM/118ML RE ENEM: 1.0000 | ENEMA | RECTAL | Status: DC | PRN

## 2020-11-09 MED ORDER — VANCOMYCIN HCL IN DEXTROSE 1-5 GM/200ML-% IV SOLN
1000.0000 mg | Freq: Two times a day (BID) | INTRAVENOUS | Status: DC
  Administered 2020-11-09: 1000 mg via INTRAVENOUS
  Filled 2020-11-09: qty 200

## 2020-11-09 MED ORDER — COCONUT OIL OIL
1.0000 "application " | TOPICAL_OIL | Status: DC | PRN
  Administered 2020-11-10: 1 via TOPICAL

## 2020-11-09 MED ORDER — DIPHENHYDRAMINE HCL 25 MG PO CAPS: 25.0000 mg | ORAL_CAPSULE | Freq: Four times a day (QID) | ORAL | Status: DC | PRN

## 2020-11-09 MED ORDER — OXYTOCIN-SODIUM CHLORIDE 30-0.9 UT/500ML-% IV SOLN
2.5000 [IU]/h | INTRAVENOUS | Status: DC
  Administered 2020-11-09: 2.5 [IU]/h via INTRAVENOUS
  Filled 2020-11-09: qty 500

## 2020-11-09 MED ORDER — ACETAMINOPHEN 325 MG PO TABS: 650.0000 mg | ORAL_TABLET | ORAL | Status: DC | PRN

## 2020-11-09 MED ORDER — BENZOCAINE-MENTHOL 20-0.5 % EX AERO
1.0000 "application " | INHALATION_SPRAY | CUTANEOUS | Status: DC | PRN
  Administered 2020-11-09: 1 via TOPICAL
  Filled 2020-11-09: qty 56

## 2020-11-09 MED ORDER — WITCH HAZEL-GLYCERIN EX PADS: 1.0000 "application " | MEDICATED_PAD | CUTANEOUS | Status: DC | PRN

## 2020-11-09 MED ORDER — IBUPROFEN 600 MG PO TABS
600.0000 mg | ORAL_TABLET | Freq: Four times a day (QID) | ORAL | Status: DC
  Administered 2020-11-09 – 2020-11-10 (×4): 600 mg via ORAL
  Filled 2020-11-09 (×4): qty 1

## 2020-11-09 MED ORDER — FENTANYL-BUPIVACAINE-NACL 0.5-0.125-0.9 MG/250ML-% EP SOLN
12.0000 mL/h | EPIDURAL | Status: DC | PRN
  Filled 2020-11-09: qty 250

## 2020-11-09 MED ORDER — SIMETHICONE 80 MG PO CHEW: 80.0000 mg | CHEWABLE_TABLET | ORAL | Status: DC | PRN

## 2020-11-09 MED ORDER — LACTATED RINGERS IV SOLN
500.0000 mL | Freq: Once | INTRAVENOUS | Status: AC
  Administered 2020-11-09: 500 mL via INTRAVENOUS

## 2020-11-09 MED ORDER — LIDOCAINE HCL (PF) 1 % IJ SOLN: 30.0000 mL | INTRAMUSCULAR | Status: DC | PRN

## 2020-11-09 MED ORDER — ACETAMINOPHEN 325 MG PO TABS
650.0000 mg | ORAL_TABLET | ORAL | Status: DC | PRN
Start: 2020-11-09 — End: 2020-11-10
  Administered 2020-11-09: 650 mg via ORAL
  Filled 2020-11-09: qty 2

## 2020-11-09 MED ORDER — ONDANSETRON HCL 4 MG PO TABS: 4.0000 mg | ORAL_TABLET | ORAL | Status: DC | PRN

## 2020-11-09 MED ORDER — DIPHENHYDRAMINE HCL 50 MG/ML IJ SOLN: 12.5000 mg | INTRAMUSCULAR | Status: DC | PRN

## 2020-11-09 MED ORDER — LIDOCAINE HCL (PF) 1 % IJ SOLN
INTRAMUSCULAR | Status: DC | PRN
  Administered 2020-11-09: 10 mL via EPIDURAL

## 2020-11-09 MED ORDER — PRENATAL MULTIVITAMIN CH
1.0000 | ORAL_TABLET | Freq: Every day | ORAL | Status: DC
  Administered 2020-11-10: 1 via ORAL
  Filled 2020-11-09: qty 1

## 2020-11-09 MED ORDER — OXYCODONE-ACETAMINOPHEN 5-325 MG PO TABS: 2.0000 | ORAL_TABLET | ORAL | Status: DC | PRN

## 2020-11-09 MED ORDER — SENNOSIDES-DOCUSATE SODIUM 8.6-50 MG PO TABS
2.0000 | ORAL_TABLET | Freq: Every day | ORAL | Status: DC
  Administered 2020-11-10: 2 via ORAL
  Filled 2020-11-09: qty 2

## 2020-11-09 MED ORDER — PHENYLEPHRINE 40 MCG/ML (10ML) SYRINGE FOR IV PUSH (FOR BLOOD PRESSURE SUPPORT): 80.0000 ug | PREFILLED_SYRINGE | INTRAVENOUS | Status: DC | PRN

## 2020-11-09 MED ORDER — TETANUS-DIPHTH-ACELL PERTUSSIS 5-2.5-18.5 LF-MCG/0.5 IM SUSY: 0.5000 mL | PREFILLED_SYRINGE | Freq: Once | INTRAMUSCULAR | Status: DC

## 2020-11-09 NOTE — Anesthesia Preprocedure Evaluation (Signed)
Anesthesia Evaluation  Patient identified by MRN, date of birth, ID band Patient awake    Reviewed: Allergy & Precautions, H&P , NPO status , Patient's Chart, lab work & pertinent test results  History of Anesthesia Complications (+) PONV and history of anesthetic complications  Airway Mallampati: II  TM Distance: >3 FB Neck ROM: full    Dental no notable dental hx. (+) Teeth Intact   Pulmonary neg pulmonary ROS,    Pulmonary exam normal breath sounds clear to auscultation       Cardiovascular negative cardio ROS Normal cardiovascular exam Rhythm:regular Rate:Normal     Neuro/Psych negative neurological ROS  negative psych ROS   GI/Hepatic negative GI ROS, Neg liver ROS,   Endo/Other  negative endocrine ROS  Renal/GU negative Renal ROS  negative genitourinary   Musculoskeletal negative musculoskeletal ROS (+)   Abdominal (+) + obese,   Peds  Hematology negative hematology ROS (+)   Anesthesia Other Findings   Reproductive/Obstetrics (+) Pregnancy                             Anesthesia Physical  Anesthesia Plan  ASA: II  Anesthesia Plan: Epidural   Post-op Pain Management:    Induction:   PONV Risk Score and Plan:   Airway Management Planned:   Additional Equipment:   Intra-op Plan:   Post-operative Plan:   Informed Consent: I have reviewed the patients History and Physical, chart, labs and discussed the procedure including the risks, benefits and alternatives for the proposed anesthesia with the patient or authorized representative who has indicated his/her understanding and acceptance.       Plan Discussed with:   Anesthesia Plan Comments:         Anesthesia Quick Evaluation

## 2020-11-09 NOTE — Anesthesia Procedure Notes (Signed)
Epidural Patient location during procedure: OB Start time: 11/09/2020 7:55 AM End time: 11/09/2020 7:58 AM  Staffing Anesthesiologist: Leilani Able, MD Performed: anesthesiologist   Preanesthetic Checklist Completed: patient identified, IV checked, site marked, risks and benefits discussed, surgical consent, monitors and equipment checked, pre-op evaluation and timeout performed  Epidural Patient position: sitting Prep: DuraPrep and site prepped and draped Patient monitoring: continuous pulse ox and blood pressure Approach: midline Location: L3-L4 Injection technique: LOR air  Needle:  Needle type: Tuohy  Needle gauge: 17 G Needle length: 9 cm and 9 Needle insertion depth: 5 cm cm Catheter type: closed end flexible Catheter size: 19 Gauge Catheter at skin depth: 10 cm Test dose: negative and Other  Assessment Events: blood not aspirated, injection not painful, no injection resistance, no paresthesia and negative IV test  Additional Notes Reason for block:procedure for pain

## 2020-11-09 NOTE — MAU Note (Signed)
Ctxs since Weds night. Denies LOF or VB. 1cm on Weds

## 2020-11-09 NOTE — Progress Notes (Addendum)
Dr Amado Nash notified of pt's admission and status. Aware of ctx pattern, sve, variables, some elevated B/Ps, positive GBS and allergies. Will admit to BS. Dr Amado Nash reviewed FM strip while on phone with RN

## 2020-11-09 NOTE — Lactation Note (Signed)
This note was copied from a baby's chart. Lactation Consultation Note  Patient Name: Kelli Perez [id number] Date: 11/09/2020 Reason for consult: L&D Initial assessment Age:35 hours   P2, Ex BF.  Mother continues to breastfeed 2 yr old. Praised her for her efforts. Per RN baby had 45 Perez shoulder dystocia.  Attempted latching.  Left baby STS on mother's chest. Reviewed hand expression.  Informed family lactation will follow up with family in [place].    Maternal Data Has patient been taught Hand Expression?: Yes Does the patient have breastfeeding experience prior to this delivery?: Yes   Interventions Interventions: Breast feeding basics reviewed;Hand express  Lactation Tools Discussed/Used     Consult Status Consult Status: Follow-up Date: 11/09/20 Follow-up type: In-patient    Dahlia Byes [facility] 11/09/2020, 2:44 PM

## 2020-11-09 NOTE — Plan of Care (Signed)
  Problem: Education: Goal: Knowledge of Childbirth will improve Outcome: Completed/Met Goal: Ability to make informed decisions regarding treatment and plan of care will improve Outcome: Completed/Met Goal: Ability to state and carry out methods to decrease the pain will improve Outcome: Completed/Met Goal: Individualized Educational Video(s) Outcome: Completed/Met   Problem: Coping: Goal: Ability to verbalize concerns and feelings about labor and delivery will improve Outcome: Completed/Met   Problem: Life Cycle: Goal: Ability to make normal progression through stages of labor will improve Outcome: Completed/Met Goal: Ability to effectively push during vaginal delivery will improve Outcome: Completed/Met   Problem: Role Relationship: Goal: Will demonstrate positive interactions with the child Outcome: Completed/Met   Problem: Safety: Goal: Risk of complications during labor and delivery will decrease Outcome: Completed/Met   Problem: Pain Management: Goal: Relief or control of pain from uterine contractions will improve Outcome: Completed/Met   Problem: Education: Goal: Knowledge of General Education information will improve Description: Including pain rating scale, medication(s)/side effects and non-pharmacologic comfort measures Outcome: Completed/Met   Problem: Health Behavior/Discharge Planning: Goal: Ability to manage health-related needs will improve Outcome: Completed/Met   Problem: Clinical Measurements: Goal: Ability to maintain clinical measurements within normal limits will improve Outcome: Completed/Met Goal: Will remain free from infection Outcome: Completed/Met Goal: Diagnostic test results will improve Outcome: Completed/Met Goal: Respiratory complications will improve Outcome: Completed/Met Goal: Cardiovascular complication will be avoided Outcome: Completed/Met   Problem: Activity: Goal: Risk for activity intolerance will decrease Outcome:  Completed/Met   Problem: Nutrition: Goal: Adequate nutrition will be maintained Outcome: Completed/Met   Problem: Coping: Goal: Level of anxiety will decrease Outcome: Completed/Met   Problem: Elimination: Goal: Will not experience complications related to bowel motility Outcome: Completed/Met Goal: Will not experience complications related to urinary retention Outcome: Completed/Met   Problem: Pain Managment: Goal: General experience of comfort will improve Outcome: Completed/Met   Problem: Safety: Goal: Ability to remain free from injury will improve Outcome: Completed/Met   Problem: Skin Integrity: Goal: Risk for impaired skin integrity will decrease Outcome: Completed/Met   Problem: Education: Goal: Knowledge of condition will improve Outcome: Completed/Met Goal: Individualized Educational Video(s) Outcome: Completed/Met Goal: Individualized Newborn Educational Video(s) Outcome: Completed/Met   Problem: Activity: Goal: Will verbalize the importance of balancing activity with adequate rest periods Outcome: Completed/Met Goal: Ability to tolerate increased activity will improve Outcome: Completed/Met   Problem: Coping: Goal: Ability to identify and utilize available resources and services will improve Outcome: Completed/Met   Problem: Life Cycle: Goal: Chance of risk for complications during the postpartum period will decrease Outcome: Completed/Met   Problem: Role Relationship: Goal: Ability to demonstrate positive interaction with newborn will improve Outcome: Completed/Met   Problem: Skin Integrity: Goal: Demonstration of wound healing without infection will improve Outcome: Completed/Met

## 2020-11-09 NOTE — H&P (Signed)
Kelli Perez is a 35 y.o. G3P1011 at [redacted]w[redacted]d gestation presents for complaint of Contractions.  No lof or vb.  +FM.  During MAU eval, occasional variable noted and pt was admitted for IOL.    Antepartum course: uncomplicated  [facility] at [facility] since 28 wks. Transfer from other local practice.   See complete pre-natal records  History OB History    Gravida  3   Para  1   Term  1   Preterm      AB  1   Living  1     SAB  1   IAB      Ectopic      Multiple  0   Live Births  1          Past Medical History:  Diagnosis Date  . Complication of anesthesia   . Medical history non-contributory   . PONV (postoperative nausea and vomiting)   . SVD (spontaneous vaginal delivery) 05/22/2018   Past Surgical History:  Procedure Laterality Date  . SKIN SURGERY     birthmark removal on back   Family History: family history is not on file. Social History:  reports that she has never smoked. She has never used smokeless tobacco. She reports previous alcohol use. She reports that she does not use drugs.  ROS: See above otherwise negative  Prenatal labs:  ABO, Rh: --/--/A POS (01/06 0530) Antibody: NEG (01/06 0530) Rubella:  immune RPR:   nr HBsAg:   nr HIV:  nr GBS: Positive/-- (01/06 0533)  1 hr Glucola: Normal Genetic screening: Normal Anatomy US: Normal  Physical Exam:   Dilation: 6 Effacement (%): 80 Station: -2 Exam by:: Mayla Biddy Blood pressure (!) 144/88, pulse 94, temperature 98.3 F (36.8 C), temperature source Oral, resp. rate 18, height 5\' 8"  (1.727 m), weight 95.7 kg, SpO2 99 %, unknown if currently breastfeeding. A&O x 3 HEENT: Normal Lungs: CTAB CV: RRR Abdominal: Soft, Non-tender and Gravid; efw 8lbs Lower Extremities: Non-edematous, Non-tender  Pelvic Exam:      Dilatation: 6cm     Effacement: 80%     Station: -2     Presentation: Cephalic; arom with clear fluid, small amount; iupc placed  Labs:  CBC:  Lab Results   Component Value Date   WBC 13.4 (H) 11/09/2020   RBC 4.78 11/09/2020   HGB 13.8 11/09/2020   HCT 42.1 11/09/2020   MCV 88.1 11/09/2020   MCH 28.9 11/09/2020   MCHC 32.8 11/09/2020   RDW 13.4 11/09/2020   PLT 266 11/09/2020   CMP:  Lab Results  Component Value Date   NA 135 05/21/2018   K 3.5 05/21/2018   CL 105 05/21/2018   CO2 18 (L) 05/21/2018   GLUCOSE 94 05/21/2018   BUN 10 05/21/2018   CREATININE 0.62 05/21/2018   CALCIUM 8.5 (L) 05/21/2018   PROT 6.6 05/21/2018   AST 21 05/21/2018   ALT 21 05/21/2018   ALBUMIN 2.9 (L) 05/21/2018   ALKPHOS 99 05/21/2018   BILITOT 0.4 05/21/2018   GFRNONAA >60 05/21/2018   GFRAA >60 05/21/2018   ANIONGAP 12 05/21/2018   Urine: Lab Results  Component Value Date   COLORURINE YELLOW 11/09/2020   APPEARANCEUR HAZY (A) 11/09/2020   LABSPEC 1.006 11/09/2020   PHURINE 6.0 11/09/2020   GLUCOSEU NEGATIVE 11/09/2020   HGBUR MODERATE (A) 11/09/2020   BILIRUBINUR NEGATIVE 11/09/2020   KETONESUR NEGATIVE 11/09/2020   PROTEINUR NEGATIVE 11/09/2020   NITRITE NEGATIVE 11/09/2020   LEUKOCYTESUR TRACE (  A) 11/09/2020   FHT:  TOCO:   Prenatal Transfer Tool  Maternal Diabetes: No Genetic Screening: Normal Maternal Ultrasounds/Referrals: Normal Fetal Ultrasounds or other Referrals:  None Maternal Substance Abuse:  No Significant Maternal Medications:  None Significant Maternal Lab Results: Group B Strep positive, no cultures done  FHT: 120s, nml variability, +accels, occasional variable, early decels also noted <64min TOCO :  q2-4 min; 4-23min decel (90s-100s) after arom and then back to baseline with accels  Assessment/Plan:  35 y.o. G3P1011 at [redacted]w[redacted]d gestation   1. Active labor - plan svd; monitor ctx closely as about qmin immed after arom; if spaces, will augment with pitocin; plan birth ball and position change to help with fetal descent 2. Fetal status - overall reassuring, but will monitor closely  3. gbs - positive, no  sensitivities done, vanc for prophylaxis 4. Rh pos 5. RI  Vick Frees 11/09/2020, 10:19 AM

## 2020-11-10 LAB — CBC
HCT: 39.1 % (ref 36.0–46.0)
Hemoglobin: 12.6 g/dL (ref 12.0–15.0)
MCH: 28.8 pg (ref 26.0–34.0)
MCHC: 32.2 g/dL (ref 30.0–36.0)
MCV: 89.3 fL (ref 80.0–100.0)
Platelets: 244 10*3/uL (ref 150–400)
RBC: 4.38 MIL/uL (ref 3.87–5.11)
RDW: 13.7 % (ref 11.5–15.5)
WBC: 13.1 10*3/uL — ABNORMAL HIGH (ref 4.0–10.5)
nRBC: 0 % (ref 0.0–0.2)

## 2020-11-10 MED ORDER — BENZOCAINE-MENTHOL 20-0.5 % EX AERO: 1.0000 "application " | INHALATION_SPRAY | CUTANEOUS | Status: DC | PRN

## 2020-11-10 MED ORDER — WITCH HAZEL-GLYCERIN EX PADS
1.0000 | MEDICATED_PAD | CUTANEOUS | 12 refills | Status: DC | PRN
Start: 2020-11-10 — End: 2023-04-17

## 2020-11-10 MED ORDER — COCONUT OIL OIL: 1.0000 "application " | TOPICAL_OIL | 0 refills | Status: DC | PRN

## 2020-11-10 MED ORDER — IBUPROFEN 600 MG PO TABS
600.0000 mg | ORAL_TABLET | Freq: Four times a day (QID) | ORAL | 0 refills | Status: DC
Start: 2020-11-10 — End: 2022-01-26

## 2020-11-10 NOTE — Discharge Summary (Signed)
Postpartum Discharge Summary  Date of Service updated 11/10/2020     Patient Name: Kelli Perez DOB: [birth year] MRN: [id number]  Date of admission: 11/09/2020 Delivery date:11/09/2020  Delivering provider: Brien Few  Date of discharge: 11/10/2020  Admitting diagnosis: Indication for care in labor or delivery [O75.9] Intrauterine pregnancy: [redacted]w[redacted]d    Secondary diagnosis:  Principal Problem:   Postpartum care following vaginal delivery (1/6) Active Problems:   Indication for care in labor or delivery   SVD (spontaneous vaginal delivery)   Shoulder dystocia, delivered   First degree perineal laceration  Additional problems: Hx. Of anxiety     Discharge diagnosis: Term Pregnancy Delivered                                              Post partum procedures:n/a Augmentation: AROM Complications:  45 second shoulder dystocia   Hospital course: Onset of Labor With Vaginal Delivery      35y.o. yo [id number] 315w2das admitted in Latent Labor on 11/09/2020. Patient had an uncomplicated labor course as follows:  Membrane Rupture Time/Date: 9:59 AM ,11/09/2020   Delivery Method:Vaginal, Spontaneous  Episiotomy: None  Lacerations:  1st degree  Patient had an uncomplicated postpartum course.  She is ambulating, tolerating a regular diet, passing flatus, and urinating well. Patient is discharged home in stable condition on 11/10/20.  Newborn Data: Birth date:11/09/2020  Birth time:1:57 PM  Gender:Female  Living status:Living  Apgars:8 ,9  Weight:3925 g  "Jonah"   Magnesium Sulfate received: No BMZ received: No Rhophylac:N/A MMR:N/A T-DaP:Given prenatally Flu: Yes  Covid vaccine: UTD Transfusion:No  Physical exam  Vitals:   11/09/20 1711 11/09/20 2220 11/10/20 0230 11/10/20 0621  BP: 121/70 114/71 104/60 118/75  Pulse: 100 87 74 79  Resp: 18 20    Temp: 98.4 F (36.9 C) 98.3 F (36.8 C) 98.2 F (36.8 C) 97.9 F (36.6 C)  TempSrc: Oral  Oral   SpO2: 96%     Weight:       Height:       Alert and oriented X3  Lungs: Clear and unlabored; prominent xyphoid process note, mild tenderness   Heart: regular rate and rhythm / no murmurs  Abdomen: soft, non-tender, non-distended              Fundus: firm, non-tender, U-1  Perineum: well approximated 1st degree; mild edema  Lochia: small rubra on pad   Extremities: trace LE edema, no calf pain or tenderness Labs: Lab Results  Component Value Date   WBC 13.1 (H) 11/10/2020   HGB 12.6 11/10/2020   HCT 39.1 11/10/2020   MCV 89.3 11/10/2020   PLT 244 11/10/2020   CMP Latest Ref Rng & Units 05/21/2018  Glucose 70 - 99 mg/dL 94  BUN 6 - 20 mg/dL 10  Creatinine 0.44 - 1.00 mg/dL 0.62  Sodium 135 - 145 mmol/L 135  Potassium 3.5 - 5.1 mmol/L 3.5  Chloride 98 - 111 mmol/L 105  CO2 22 - 32 mmol/L 18(L)  Calcium 8.9 - 10.3 mg/dL 8.5(L)  Total Protein 6.5 - 8.1 g/dL 6.6  Total Bilirubin 0.3 - 1.2 mg/dL 0.4  Alkaline Phos 38 - 126 U/L 99  AST 15 - 41 U/L 21  ALT 0 - 44 U/L 21   Edinburgh Score: Edinburgh Postnatal Depression Scale Screening Tool 11/10/2020  I have been able  to laugh and see the funny side of things. 0  I have looked forward with enjoyment to things. 0  I have blamed myself unnecessarily when things went wrong. 1  I have been anxious or worried for no good reason. 2  I have felt scared or panicky for no good reason. 0  Things have been getting on top of me. 1  I have been so unhappy that I have had difficulty sleeping. 0  I have felt sad or miserable. 0  I have been so unhappy that I have been crying. 0  The thought of harming myself has occurred to me. 0  Edinburgh Postnatal Depression Scale Total 4      After visit meds:  Allergies as of 11/10/2020      Reactions   Cefzil [cefprozil] Other (See Comments)   Uknown   Penicillins Other (See Comments)   Unknown   Sulfa Antibiotics Other (See Comments)   Uknown reaction.      Medication List    TAKE these medications   acetaminophen  325 MG tablet Commonly known as: Tylenol Take 2 tablets (650 mg total) by mouth every 4 (four) hours as needed (for pain scale < 4).   benzocaine-Menthol 20-0.5 % Aero Commonly known as: DERMOPLAST Apply 1 application topically as needed for irritation (perineal discomfort).   coconut oil Oil Apply 1 application topically as needed.   ibuprofen 600 MG tablet Commonly known as: ADVIL Take 1 tablet (600 mg total) by mouth every 6 (six) hours.   prenatal multivitamin Tabs tablet Take 1 tablet by mouth at bedtime.   sertraline 50 MG tablet Commonly known as: ZOLOFT Take 50 mg by mouth daily.   witch hazel-glycerin pad Commonly known as: TUCKS Apply 1 application topically as needed for hemorrhoids.            Discharge Care Instructions  (From admission, onward)         Start     Ordered   11/10/20 0000  Discharge wound care:       Comments: Warm water sitz baths as needed   11/10/20 1202           Discharge home in stable condition Infant Feeding: Breast Infant Disposition:home with mother Discharge instruction: per After Visit Summary and Postpartum booklet. Activity: Advance as tolerated. Pelvic rest for 6 weeks.  Diet: low salt diet Anticipated Birth Control: not discussed Postpartum Appointment:2 weeks and 6 weeks Additional Postpartum F/U: Postpartum Depression checkup Future Appointments:No future appointments. Follow up Visit:  Follow-up Information    Azucena Fallen, MD. Schedule an appointment as soon as possible for a visit in 2 week(s).   Specialty: Obstetrics and Gynecology Why: PP mood check, then 6 week PP visit  Contact information: [address] [place] [address] [phone]                 11/10/2020 Darliss Cheney, CNM

## 2020-11-10 NOTE — Progress Notes (Signed)
PPD #1, SVD, s/p shoulder dystocia, 1st degree perineal, baby boy "Jonah"  S:  Reports feeling overall well, c/o mild headache but hasn't slept and not drinking much water. Denies visual changes or RUQ pain.  C/o a knot at base of sternum.              Tolerating po/ No nausea or vomiting / Denies dizziness or SOB or CP             Bleeding is getting lighter             Pain controlled with Motrin             Up ad lib / ambulatory / voiding QS without difficulty   Newborn breast feeding without diffculty / Circumcision - complete  O:               VS: BP 118/75 (BP Location: Right Arm)   Pulse 79   Temp 97.9 F (36.6 C)   Resp 20   Ht 5\' 8"  (1.727 m)   Wt 95.7 kg   SpO2 96%   Breastfeeding Unknown   BMI 32.08 kg/m  Patient Vitals for the past 24 hrs:  BP Temp Temp src Pulse Resp SpO2  11/10/20 0621 118/75 97.9 F (36.6 C) -- 79 -- --  11/10/20 0230 104/60 98.2 F (36.8 C) Oral 74 -- --  11/09/20 2220 114/71 98.3 F (36.8 C) -- 87 20 --  11/09/20 1711 121/70 98.4 F (36.9 C) Oral 100 18 96 %  11/09/20 1610 128/71 98.8 F (37.1 C) Oral (!) 107 18 --  11/09/20 1530 125/79 -- -- 94 -- --  11/09/20 1515 118/90 -- -- 100 -- --  11/09/20 1500 120/80 -- -- 94 18 --  11/09/20 1446 125/69 -- -- 92 18 --  11/09/20 1430 126/83 -- -- 100 18 --  11/09/20 1415 136/72 -- -- (!) 101 -- --  11/09/20 1400 131/71 -- -- (!) 103 -- --  11/09/20 1330 131/89 -- -- 92 18 --  11/09/20 1300 118/76 -- -- 91 -- --  11/09/20 1230 123/72 -- -- 91 18 --    LABS:             Recent Labs    11/09/20 0533 11/10/20 0529  WBC 13.4* 13.1*  HGB 13.8 12.6  PLT 266 244               Blood type: --/--/A POS (01/06 0530)  Rubella: Immune (06/08 0000)                     I&O: Intake/Output      01/06 0701 01/07 0700 01/07 0701 01/08 0700   I.V. (mL/kg) 0 (0)    Other 0    IV Piggyback 200    Total Intake(mL/kg) 200 (2.1)    Urine (mL/kg/hr) 825 (0.4)    Blood 284    Total Output 1109    Net  -909         Urine Occurrence 1 x     Flu: UTD Tdap:UTD Covid:UTD             Physical Exam:             Alert and oriented X3  Lungs: Clear and unlabored; prominent xyphoid process note, mild tenderness   Heart: regular rate and rhythm / no murmurs  Abdomen: soft, non-tender, non-distended  Fundus: firm, non-tender, U-1  Perineum: well approximated 1st degree; mild edema  Lochia: small rubra on pad   Extremities: trace LE edema, no calf pain or tenderness    A/P: PPD # 1, SVD  1st degree perineal repair   Hx. Of anxiety    - on Zoloft 50mg  daily   - Has counselor, encouraged to make a PP visit   Doing well - stable status  Routine post partum orders  Abdominal binder   Discharge home at 24 hrs  Discharge instructions and warning s/s reviewed  Advised to f/u in 1 week if prominent xyphoid process tenderness still present  Discussed 2 week f/u for mood check if needed, otherwise 6 week PP visit   , MSN, CNM [facility]

## 2020-11-10 NOTE — Anesthesia Postprocedure Evaluation (Signed)
Anesthesia Post Note  Patient: Environmental manager  Procedure(s) Performed: AN AD HOC LABOR EPIDURAL     Patient location during evaluation: Mother Baby Anesthesia Type: Epidural Level of consciousness: awake and alert Pain management: pain level controlled Vital Signs Assessment: post-procedure vital signs reviewed and stable Respiratory status: spontaneous breathing, nonlabored ventilation and respiratory function stable Cardiovascular status: stable Postop Assessment: no headache, no backache and epidural receding Anesthetic complications: no   No complications documented.  Last Vitals:  Vitals:   11/10/20 0230 11/10/20 0621  BP: 104/60 118/75  Pulse: 74 79  Resp:    Temp: 36.8 C 36.6 C  SpO2:      Last Pain:  Vitals:   11/10/20 0621  TempSrc:   PainSc: 0-No pain   Pain Goal: Patients Stated Pain Goal: 0 (11/09/20 0410)                 Marrion Coy

## 2020-11-10 NOTE — Lactation Note (Addendum)
This note was copied from a baby's chart. Lactation Consultation Note  Patient Name: Kelli Perez [id number] Date: 11/10/2020 Reason for consult: Follow-up assessment;Term;Mother's request Age:35 hours P2, term LGA female infant. Infant had one void and one stool. Mom has DEBP at home. Per mom, she feels infant latch maybe be shallow, mom BF infant 5 times since his birth for ten minutes or longer. Mom is still BF her two year old son and plans to tatum BF both children, mom understands to BF infant first before BF her two year old son. LC discussed  hand expression and mom easily expressed 4 mls of colostrum in a bullet, she offer to infant after she latches him at the breast by spoon. This is mom's first time trying the football hold position, mom latched infant on her left breast, infant latched with depth, swallows observed, infant was still BF after 10 minutes when LC left the room. Per mom, she feels this is his best latch and she can feel a difference, mom understand to re-latch infant if she doesn't feel a tug when infant is latch. Mom will continue to BF infant according to hunger cues, 8 to 12+ times within 24 hours, STS. Mom knows to call [facility] services,  if she has questions, concerns or need assistance with latching infant at the breast.   Mom made aware of O/P services, breastfeeding support groups, community resources, and our phone # for post-discharge questions.  Maternal Data Formula Feeding for Exclusion: No Has patient been taught Hand Expression?: Yes Does the patient have breastfeeding experience prior to this delivery?: Yes  Feeding Feeding Type: Breast Fed  LATCH Score Latch: Grasps breast easily, tongue down, lips flanged, rhythmical sucking.  Audible Swallowing: Spontaneous and intermittent  Type of Nipple: Everted at rest and after stimulation  Comfort (Breast/Nipple): Soft / non-tender  Hold (Positioning): Assistance needed to correctly position infant at  breast and maintain latch.  LATCH Score: 9  Interventions Interventions: Breast feeding basics reviewed;Assisted with latch;Breast compression;Skin to skin;Adjust position;Breast massage;Support pillows;Hand express;Position options;DEBP  Lactation Tools Discussed/Used WIC Program: No   Consult Status Consult Status: Follow-up Date: 11/10/20 Follow-up type: In-patient    Danelle Earthly 11/10/2020, 12:21 AM

## 2020-11-10 NOTE — Progress Notes (Signed)
MOB was referred for history of depression/anxiety. * Referral screened out by Clinical Social Worker because none of the following criteria appear to apply: ~ History of anxiety/depression during this pregnancy, or of post-partum depression following prior delivery. ~ Diagnosis of anxiety and/or depression within last 3 years OR * MOB's symptoms currently being treated with medication and/or therapy. Per further chart review, it is noted that MOB has active prescription for Zoloft.     Please contact the Clinical Social Worker if needs arise, by MOB request, or if MOB scores greater than 9/yes to question 10 on Edinburgh Postpartum Depression Screen.   Kelli Perez, MSW, LCSW [facility] [phone]  

## 2020-11-10 NOTE — Lactation Note (Signed)
This note was copied from a baby's chart. Lactation Consultation Note  Patient Name: Kelli Perez [id number] Date: 11/10/2020 Reason for consult: Follow-up assessment;Term;Infant weight loss;Other (Comment) (3 % weight loss)- Bili check at 16 hours - 0.0  Age:35 hours  Per mom the baby recently fed @1000  for 15 mins and has been cluster feeding.  LC reviewed the doc flow sheets and updated per mom.  Per mom mentioned she is breast feeding a 2 1/35 yo at home few times day.  Per mom breast feeding with this baby is so much better compared to my 1st baby.  Mom denies sore nipples. Sore nipple and engorgement prevention and tx.  Per mom has a DEBP at home.  Mom has the [facility] brochure with resource numbers.    Maternal Data    Feeding Feeding Type:  (per mom the baby recently fed per mom)  LATCH Score                   Interventions Interventions: Breast feeding basics reviewed  Lactation Tools Discussed/Used     Consult Status Consult Status: Complete Date: 11/10/20    01/08/21 Josean Lycan 11/10/2020, 11:05 AM

## 2020-11-13 ENCOUNTER — Inpatient Hospital Stay (HOSPITAL_COMMUNITY): Admission: AD | Admit: 2020-11-13 | Payer: 59 | Source: Home / Self Care | Admitting: Obstetrics & Gynecology

## 2020-11-13 ENCOUNTER — Inpatient Hospital Stay (HOSPITAL_COMMUNITY): Payer: 59

## 2021-05-10 DIAGNOSIS — R5383 Other fatigue: Secondary | ICD-10-CM | POA: Diagnosis not present

## 2021-05-10 DIAGNOSIS — N943 Premenstrual tension syndrome: Secondary | ICD-10-CM | POA: Diagnosis not present

## 2021-05-10 DIAGNOSIS — R4586 Emotional lability: Secondary | ICD-10-CM | POA: Insufficient documentation

## 2021-05-11 DIAGNOSIS — E559 Vitamin D deficiency, unspecified: Secondary | ICD-10-CM | POA: Insufficient documentation

## 2021-06-01 DIAGNOSIS — E663 Overweight: Secondary | ICD-10-CM | POA: Diagnosis not present

## 2021-06-01 DIAGNOSIS — R5383 Other fatigue: Secondary | ICD-10-CM | POA: Diagnosis not present

## 2021-06-01 DIAGNOSIS — E7211 Homocystinuria: Secondary | ICD-10-CM | POA: Diagnosis not present

## 2021-06-01 DIAGNOSIS — Q999 Chromosomal abnormality, unspecified: Secondary | ICD-10-CM | POA: Diagnosis not present

## 2021-09-18 DIAGNOSIS — R5383 Other fatigue: Secondary | ICD-10-CM | POA: Diagnosis not present

## 2021-09-18 DIAGNOSIS — E559 Vitamin D deficiency, unspecified: Secondary | ICD-10-CM | POA: Diagnosis not present

## 2021-09-20 DIAGNOSIS — R238 Other skin changes: Secondary | ICD-10-CM | POA: Diagnosis not present

## 2021-09-20 DIAGNOSIS — K9049 Malabsorption due to intolerance, not elsewhere classified: Secondary | ICD-10-CM | POA: Diagnosis not present

## 2021-09-20 DIAGNOSIS — R5383 Other fatigue: Secondary | ICD-10-CM | POA: Diagnosis not present

## 2021-09-20 DIAGNOSIS — R140 Abdominal distension (gaseous): Secondary | ICD-10-CM | POA: Diagnosis not present

## 2021-11-24 DIAGNOSIS — F4323 Adjustment disorder with mixed anxiety and depressed mood: Secondary | ICD-10-CM | POA: Diagnosis not present

## 2021-12-01 DIAGNOSIS — F4310 Post-traumatic stress disorder, unspecified: Secondary | ICD-10-CM | POA: Diagnosis not present

## 2022-01-03 DIAGNOSIS — R5383 Other fatigue: Secondary | ICD-10-CM | POA: Diagnosis not present

## 2022-01-03 DIAGNOSIS — N943 Premenstrual tension syndrome: Secondary | ICD-10-CM | POA: Diagnosis not present

## 2022-01-03 DIAGNOSIS — E639 Nutritional deficiency, unspecified: Secondary | ICD-10-CM | POA: Diagnosis not present

## 2022-01-03 DIAGNOSIS — R790 Abnormal level of blood mineral: Secondary | ICD-10-CM | POA: Diagnosis not present

## 2022-01-16 DIAGNOSIS — Z3200 Encounter for pregnancy test, result unknown: Secondary | ICD-10-CM | POA: Diagnosis not present

## 2022-01-16 DIAGNOSIS — Z3689 Encounter for other specified antenatal screening: Secondary | ICD-10-CM | POA: Diagnosis not present

## 2022-01-21 DIAGNOSIS — O209 Hemorrhage in early pregnancy, unspecified: Secondary | ICD-10-CM | POA: Diagnosis not present

## 2022-01-23 DIAGNOSIS — E559 Vitamin D deficiency, unspecified: Secondary | ICD-10-CM | POA: Diagnosis not present

## 2022-01-23 DIAGNOSIS — Z3A01 Less than 8 weeks gestation of pregnancy: Secondary | ICD-10-CM | POA: Diagnosis not present

## 2022-01-23 DIAGNOSIS — O209 Hemorrhage in early pregnancy, unspecified: Secondary | ICD-10-CM | POA: Diagnosis not present

## 2022-01-24 ENCOUNTER — Encounter (HOSPITAL_COMMUNITY): Payer: Self-pay | Admitting: Obstetrics & Gynecology

## 2022-01-24 ENCOUNTER — Inpatient Hospital Stay (HOSPITAL_COMMUNITY)
Admission: AD | Admit: 2022-01-24 | Discharge: 2022-01-24 | Disposition: A | Discharge status: Home / Self Care | Payer: BC Managed Care – PPO | Attending: Obstetrics and Gynecology | Admitting: Obstetrics and Gynecology

## 2022-01-24 ENCOUNTER — Other Ambulatory Visit: Payer: Self-pay

## 2022-01-24 ENCOUNTER — Other Ambulatory Visit: Payer: Self-pay | Admitting: Obstetrics and Gynecology

## 2022-01-24 DIAGNOSIS — O009 Unspecified ectopic pregnancy without intrauterine pregnancy: Secondary | ICD-10-CM | POA: Diagnosis not present

## 2022-01-24 DIAGNOSIS — O0281 Inappropriate change in quantitative human chorionic gonadotropin (hCG) in early pregnancy: Secondary | ICD-10-CM | POA: Diagnosis not present

## 2022-01-24 LAB — CBC WITH DIFFERENTIAL/PLATELET
Abs Immature Granulocytes: 0.03 10*3/uL (ref 0.00–0.07)
Basophils Absolute: 0.1 10*3/uL (ref 0.0–0.1)
Basophils Relative: 1 %
Eosinophils Absolute: 0.2 10*3/uL (ref 0.0–0.5)
Eosinophils Relative: 2 %
HCT: 38.1 % (ref 36.0–46.0)
Hemoglobin: 13 g/dL (ref 12.0–15.0)
Immature Granulocytes: 0 %
Lymphocytes Relative: 17 %
Lymphs Abs: 2 10*3/uL (ref 0.7–4.0)
MCH: 30.6 pg (ref 26.0–34.0)
MCHC: 34.1 g/dL (ref 30.0–36.0)
MCV: 89.6 fL (ref 80.0–100.0)
Monocytes Absolute: 0.6 10*3/uL (ref 0.1–1.0)
Monocytes Relative: 5 %
Neutro Abs: 9 10*3/uL — ABNORMAL HIGH (ref 1.7–7.7)
Neutrophils Relative %: 75 %
Platelets: 289 10*3/uL (ref 150–400)
RBC: 4.25 MIL/uL (ref 3.87–5.11)
RDW: 12.4 % (ref 11.5–15.5)
WBC: 11.9 10*3/uL — ABNORMAL HIGH (ref 4.0–10.5)
nRBC: 0 % (ref 0.0–0.2)

## 2022-01-24 LAB — COMPREHENSIVE METABOLIC PANEL
ALT: 20 U/L (ref 0–44)
AST: 17 U/L (ref 15–41)
Albumin: 3.9 g/dL (ref 3.5–5.0)
Alkaline Phosphatase: 52 U/L (ref 38–126)
Anion gap: 6 (ref 5–15)
BUN: 12 mg/dL (ref 6–20)
CO2: 25 mmol/L (ref 22–32)
Calcium: 9.4 mg/dL (ref 8.9–10.3)
Chloride: 108 mmol/L (ref 98–111)
Creatinine, Ser: 0.87 mg/dL (ref 0.44–1.00)
GFR, Estimated: >60 mL/min (ref >60)
Glucose, Bld: 89 mg/dL (ref 70–99)
Potassium: 3.8 mmol/L (ref 3.5–5.1)
Sodium: 139 mmol/L (ref 135–145)
Total Bilirubin: 0.4 mg/dL (ref 0.3–1.2)
Total Protein: 6.7 g/dL (ref 6.5–8.1)

## 2022-01-24 LAB — HCG, QUANTITATIVE, PREGNANCY: hCG, Beta Chain, Quant, S: 2369 m[IU]/mL — ABNORMAL HIGH (ref <5)

## 2022-01-24 MED ORDER — METHOTREXATE FOR ECTOPIC PREGNANCY
50.0000 mg/m2 | Freq: Once | INTRAMUSCULAR | Status: AC
  Administered 2022-01-24: 105 mg via INTRAMUSCULAR
  Filled 2022-01-24: qty 10

## 2022-01-24 NOTE — Discharge Instructions (Signed)
Return to MAU on Sunday for repeat Hormone level ?

## 2022-01-24 NOTE — MAU Provider Note (Signed)
MAU MSE Note ? ?S ?Ms. Kelli Perez is a 36 y.o. [id number] patient who presents to MAU today with complaint of ectopic pregnancy. ? ?Previously called by RN at [facility] and notified patient would be coming for methotrexate for ectopic pregnancy. ?Patient reports she is not having any abdominal pain, does have some light vaginal bleeding.   ? ?O ?BP (!) 141/86   Pulse (!) 106   Temp 99.2 ?F (37.3 ?C)   Resp 18   Ht 5\' 8"  (1.727 m)   Wt 91.2 kg   LMP 12/14/2021   BMI 30.56 kg/m?  ?Physical Exam ?Vitals reviewed.  ?Constitutional:   ?   General: She is not in acute distress. ?   Appearance: She is well-developed. She is not diaphoretic.  ?Eyes:  ?   General: No scleral icterus. ?Pulmonary:  ?   Effort: Pulmonary effort is normal. No respiratory distress.  ?Skin: ?   General: Skin is warm and dry.  ?Neurological:  ?   Mental Status: She is alert.  ?   Coordination: Coordination normal.  ? ? ?A ?Medical screening exam complete ? ? ?P ?Medically stable ?Private provider responsible for care and treatment plan ? ?02/11/2022, MD ?01/24/2022 3:06 PM  ? ?

## 2022-01-24 NOTE — MAU Note (Signed)
.  Kelli Perez is a 36 y.o. at Unknown here in MAU reporting: sent from office with ectopic pregnancy to be treated with MTX. Pt denies any pain at this time but dows have some vaginal bleeding.  ?LMP: 12/14/21 ?Onset of complaint:  ?Pain score: 0 ?Vitals:  ? 01/24/22 1459  ?BP: (!) 141/86  ?Pulse: (!) 106  ?Resp: 18  ?Temp: 99.2 ?F (37.3 ?C)  ?   ?FHT:n/a ? ?Lab orders placed from triage:   ? ?

## 2022-01-25 ENCOUNTER — Inpatient Hospital Stay (HOSPITAL_COMMUNITY): Payer: BC Managed Care – PPO | Admitting: Anesthesiology

## 2022-01-25 ENCOUNTER — Encounter (HOSPITAL_COMMUNITY): Payer: Self-pay | Admitting: Obstetrics & Gynecology

## 2022-01-25 ENCOUNTER — Ambulatory Visit (HOSPITAL_COMMUNITY)
Admission: AD | Admit: 2022-01-25 | Discharge: 2022-01-26 | Disposition: A | Discharge status: Home / Self Care | Payer: BC Managed Care – PPO | Attending: Obstetrics & Gynecology | Admitting: Obstetrics & Gynecology

## 2022-01-25 ENCOUNTER — Encounter (HOSPITAL_COMMUNITY)
Admission: AD | Disposition: A | Discharge status: Home / Self Care | Payer: Self-pay | Source: Home / Self Care | Attending: Obstetrics & Gynecology

## 2022-01-25 ENCOUNTER — Inpatient Hospital Stay (HOSPITAL_COMMUNITY): Payer: BC Managed Care – PPO

## 2022-01-25 ENCOUNTER — Other Ambulatory Visit: Payer: Self-pay

## 2022-01-25 DIAGNOSIS — K661 Hemoperitoneum: Secondary | ICD-10-CM | POA: Diagnosis not present

## 2022-01-25 DIAGNOSIS — R58 Hemorrhage, not elsewhere classified: Secondary | ICD-10-CM | POA: Diagnosis not present

## 2022-01-25 DIAGNOSIS — Z79899 Other long term (current) drug therapy: Secondary | ICD-10-CM | POA: Insufficient documentation

## 2022-01-25 DIAGNOSIS — O008 Other ectopic pregnancy without intrauterine pregnancy: Secondary | ICD-10-CM | POA: Diagnosis not present

## 2022-01-25 DIAGNOSIS — R1900 Intra-abdominal and pelvic swelling, mass and lump, unspecified site: Secondary | ICD-10-CM | POA: Diagnosis not present

## 2022-01-25 DIAGNOSIS — O009 Unspecified ectopic pregnancy without intrauterine pregnancy: Secondary | ICD-10-CM | POA: Diagnosis not present

## 2022-01-25 DIAGNOSIS — R000 Tachycardia, unspecified: Secondary | ICD-10-CM | POA: Diagnosis not present

## 2022-01-25 DIAGNOSIS — O00102 Left tubal pregnancy without intrauterine pregnancy: Secondary | ICD-10-CM | POA: Diagnosis not present

## 2022-01-25 DIAGNOSIS — R42 Dizziness and giddiness: Secondary | ICD-10-CM | POA: Diagnosis not present

## 2022-01-25 DIAGNOSIS — Z3A01 Less than 8 weeks gestation of pregnancy: Secondary | ICD-10-CM | POA: Insufficient documentation

## 2022-01-25 HISTORY — PX: DIAGNOSTIC LAPAROSCOPY WITH REMOVAL OF ECTOPIC PREGNANCY: SHX6449

## 2022-01-25 LAB — CBC
HCT: 38.4 % (ref 36.0–46.0)
Hemoglobin: 12.7 g/dL (ref 12.0–15.0)
MCH: 30 pg (ref 26.0–34.0)
MCHC: 33.1 g/dL (ref 30.0–36.0)
MCV: 90.6 fL (ref 80.0–100.0)
Platelets: 295 10*3/uL (ref 150–400)
RBC: 4.24 MIL/uL (ref 3.87–5.11)
RDW: 12.6 % (ref 11.5–15.5)
WBC: 14.8 10*3/uL — ABNORMAL HIGH (ref 4.0–10.5)
nRBC: 0 % (ref 0.0–0.2)

## 2022-01-25 LAB — GLUCOSE, CAPILLARY: Glucose-Capillary: 82 mg/dL (ref 70–99)

## 2022-01-25 LAB — TYPE AND SCREEN
ABO/RH(D): A POS
Antibody Screen: NEGATIVE

## 2022-01-25 LAB — HCG, QUANTITATIVE, PREGNANCY: hCG, Beta Chain, Quant, S: 974 m[IU]/mL — ABNORMAL HIGH (ref <5)

## 2022-01-25 SURGERY — LAPAROSCOPY, WITH ECTOPIC PREGNANCY SURGICAL TREATMENT
Anesthesia: General

## 2022-01-25 MED ORDER — PHENYLEPHRINE HCL (PRESSORS) 10 MG/ML IV SOLN
INTRAVENOUS | Status: DC | PRN
Start: 2022-01-25 — End: 2022-01-26
  Administered 2022-01-25: 120 ug via INTRAVENOUS
  Administered 2022-01-26 (×2): 80 ug via INTRAVENOUS

## 2022-01-25 MED ORDER — BUPIVACAINE HCL (PF) 0.25 % IJ SOLN
INTRAMUSCULAR | Status: AC
  Filled 2022-01-25: qty 30

## 2022-01-25 MED ORDER — LACTATED RINGERS IV BOLUS
500.0000 mL | Freq: Once | INTRAVENOUS | Status: AC
  Administered 2022-01-25: 500 mL via INTRAVENOUS

## 2022-01-25 MED ORDER — PROPOFOL 10 MG/ML IV BOLUS
INTRAVENOUS | Status: AC
  Filled 2022-01-25: qty 20

## 2022-01-25 MED ORDER — SUCCINYLCHOLINE CHLORIDE 200 MG/10ML IV SOSY
PREFILLED_SYRINGE | INTRAVENOUS | Status: AC
Start: 2022-01-25 — End: ?
  Filled 2022-01-25: qty 10

## 2022-01-25 MED ORDER — FENTANYL CITRATE (PF) 250 MCG/5ML IJ SOLN
INTRAMUSCULAR | Status: AC
  Filled 2022-01-25: qty 5

## 2022-01-25 MED ORDER — SCOPOLAMINE 1 MG/3DAYS TD PT72
MEDICATED_PATCH | TRANSDERMAL | Status: AC
  Filled 2022-01-25: qty 1

## 2022-01-25 MED ORDER — MIDAZOLAM HCL 2 MG/2ML IJ SOLN
INTRAMUSCULAR | Status: AC
  Filled 2022-01-25: qty 2

## 2022-01-25 SURGICAL SUPPLY — 36 items
BAG RETRIEVAL 10 (BASKET) ×1
CABLE HIGH FREQUENCY MONO STRZ (ELECTRODE) IMPLANT
CATH ROBINSON RED A/P 16FR (CATHETERS) ×2 IMPLANT
DERMABOND ADVANCED (GAUZE/BANDAGES/DRESSINGS) ×1
DERMABOND ADVANCED .7 DNX12 (GAUZE/BANDAGES/DRESSINGS) ×1 IMPLANT
DRSG OPSITE POSTOP 3X4 (GAUZE/BANDAGES/DRESSINGS) IMPLANT
DURAPREP 26ML APPLICATOR (WOUND CARE) ×2 IMPLANT
FILTER SMOKE EVAC LAPAROSHD (FILTER) IMPLANT
GLOVE SURG ENC MOIS LTX SZ7 (GLOVE) ×2 IMPLANT
GLOVE SURG UNDER POLY LF SZ7 (GLOVE) ×4 IMPLANT
GOWN STRL REUS W/ TWL LRG LVL3 (GOWN DISPOSABLE) ×3 IMPLANT
GOWN STRL REUS W/TWL LRG LVL3 (GOWN DISPOSABLE) ×3
IRRIG SUCT STRYKERFLOW 2 WTIP (MISCELLANEOUS)
IRRIGATION SUCT STRKRFLW 2 WTP (MISCELLANEOUS) IMPLANT
KIT TURNOVER KIT B (KITS) ×2 IMPLANT
LIGASURE VESSEL 5MM BLUNT TIP (ELECTROSURGICAL) ×1 IMPLANT
MANIPULATOR UTERINE 4.5 ZUMI (MISCELLANEOUS) ×2 IMPLANT
NS IRRIG 1000ML POUR BTL (IV SOLUTION) ×2 IMPLANT
PACK LAPAROSCOPY BASIN (CUSTOM PROCEDURE TRAY) ×2 IMPLANT
PACK TRENDGUARD 450 HYBRID PRO (MISCELLANEOUS) IMPLANT
POUCH SPECIMEN RETRIEVAL 10MM (ENDOMECHANICALS) IMPLANT
PROTECTOR NERVE ULNAR (MISCELLANEOUS) ×4 IMPLANT
SCISSORS LAP 5X35 DISP (ENDOMECHANICALS) IMPLANT
SET TUBE SMOKE EVAC HIGH FLOW (TUBING) ×2 IMPLANT
SLEEVE ENDOPATH XCEL 5M (ENDOMECHANICALS) ×2 IMPLANT
SOLUTION ELECTROLUBE (MISCELLANEOUS) IMPLANT
SUT VICRYL 0 UR6 27IN ABS (SUTURE) ×2 IMPLANT
SUT VICRYL 4-0 PS2 18IN ABS (SUTURE) ×2 IMPLANT
SYS BAG RETRIEVAL 10MM (BASKET) ×1
SYSTEM BAG RETRIEVAL 10MM (BASKET) IMPLANT
TOWEL GREEN STERILE FF (TOWEL DISPOSABLE) ×4 IMPLANT
TRENDGUARD 450 HYBRID PRO PACK (MISCELLANEOUS)
TROCAR BALLN 12MMX100 BLUNT (TROCAR) IMPLANT
TROCAR BALLN GELPORT 12X130M (ENDOMECHANICALS) ×1 IMPLANT
TROCAR XCEL NON-BLD 5MMX100MML (ENDOMECHANICALS) ×2 IMPLANT
WARMER LAPAROSCOPE (MISCELLANEOUS) ×2 IMPLANT

## 2022-01-25 NOTE — MAU Note (Signed)
.  Kelli Perez is a 36 y.o. at [redacted]w[redacted]d here in MAU reporting: pt arrives via ems with complaint of worsening abd pain today , pt is s/p MTX for ectopic pregnancy yesterday. Bleeding has not increased. Pt states she was very dizzy at home and thought she was going to pass out. Vss per EMS, cbg wnl.  ?LMP: 12/14/2021 ?Onset of complaint: today ?Pain score: 7/10 ?Vitals:  ? 01/25/22 2050  ?BP: 127/75  ?Pulse: 93  ?Resp: 16  ?Temp: 98.4 ?F (36.9 ?C)  ?SpO2: 100%  ?   ?FHT:na ?Lab orders placed from triage:     ?  ? ?

## 2022-01-25 NOTE — Anesthesia Preprocedure Evaluation (Addendum)
Anesthesia Evaluation  ?Patient identified by MRN, date of birth, ID band ?Patient awake ? ? ? ?Reviewed: ?Allergy & Precautions, NPO status , Patient's Chart, lab work & pertinent test resultsPreop documentation limited or incomplete due to emergent nature of procedure. ? ?History of Anesthesia Complications ?(+) PONV and history of anesthetic complications ? ?Airway ?Mallampati: I ? ?TM Distance: >3 FB ?Neck ROM: Full ? ? ? Dental ? ?(+) Teeth Intact, Dental Advisory Given ?  ?Pulmonary ?neg pulmonary ROS,  ?  ?Pulmonary exam normal ? ? ? ? ? ? ? Cardiovascular ?negative cardio ROS ? ? ?Rhythm:Regular Rate:Normal ? ? ?  ?Neuro/Psych ?negative neurological ROS ? negative psych ROS  ? GI/Hepatic ?negative GI ROS, Neg liver ROS,   ?Endo/Other  ?negative endocrine ROS ? Renal/GU ?negative Renal ROS  ? ?  ?Musculoskeletal ?negative musculoskeletal ROS ?(+)  ? Abdominal ?Normal abdominal exam  (+)   ?Peds ? Hematology ?negative hematology ROS ?(+)   ?Anesthesia Other Findings ? ? Reproductive/Obstetrics ? ?  ? ? ? ? ? ? ? ? ? ? ? ? ? ?  ?  ? ? ? ? ? ? ? ?Anesthesia Physical ?Anesthesia Plan ? ?ASA: 2 and emergent ? ?Anesthesia Plan: General  ? ?Post-op Pain Management:   ? ?Induction: Intravenous, Rapid sequence and Cricoid pressure planned ? ?PONV Risk Score and Plan: 4 or greater and Ondansetron, Dexamethasone, Midazolam and Scopolamine patch - Pre-op ? ?Airway Management Planned: Oral ETT ? ?Additional Equipment: None ? ?Intra-op Plan:  ? ?Post-operative Plan: Extubation in OR ? ?Informed Consent: I have reviewed the patients History and Physical, chart, labs and discussed the procedure including the risks, benefits and alternatives for the proposed anesthesia with the patient or authorized representative who has indicated his/her understanding and acceptance.  ? ? ? ?Dental advisory given ? ?Plan Discussed with: CRNA ? ?Anesthesia Plan Comments: (Lab Results ?     Component                 Value               Date                 ?     WBC                      14.8 (H)            01/25/2022           ?     HGB                      12.7                01/25/2022           ?     HCT                      38.4                01/25/2022           ?     MCV                      90.6                01/25/2022           ?     PLT  295                 01/25/2022           ?)  ? ? ? ? ? ?Anesthesia Quick Evaluation ? ?

## 2022-01-25 NOTE — MAU Provider Note (Signed)
?History  ?  ? ?CSN: [id number] ? ?Arrival date and time: 01/25/22 2038 ? ? Event Date/Time  ? First Provider Initiated Contact with Patient 01/25/22 2152   ?  ? ?Chief Complaint  ?Patient presents with  ? Abdominal Pain  ? ?Kelli Perez is a 36 y.o. [id number] at [redacted]w[redacted]d by LMP with known ectopic pregnancy.  Patient reports she receives care at [facility].  She presents today, via EMS, for Abdominal Pain.  She states the pain started 2 hours after her injection of MTX and was initially a sharp shooting pain that gradually improved, but never resolved.  She states she then began to experience some abdominal sensitivity that caused increased pain with movement.  She states around 7 or 8 pm she started to feel dizzy and decided to contact EMS.  She states her pain is currently a 6-7/10.  Patient endorses vaginal bleeding and states it has been present since Saturday.  She reports she last ate, an applesauce pouch, prior to EMS arrival. ? ? ?OB History   ? ? Gravida  ?4  ? Para  ?2  ? Term  ?2  ? Preterm  ?   ? AB  ?1  ? Living  ?2  ?  ? ? SAB  ?1  ? IAB  ?   ? Ectopic  ?   ? Multiple  ?0  ? Live Births  ?2  ?   ?  ?  ? ? ?Past Medical History:  ?Diagnosis Date  ? Complication of anesthesia   ? Medical history non-contributory   ? PONV (postoperative nausea and vomiting)   ? SVD (spontaneous vaginal delivery) 05/22/2018  ? ? ?Past Surgical History:  ?Procedure Laterality Date  ? SKIN SURGERY    ? birthmark removal on back  ? ? ?History reviewed. No pertinent family history. ? ?Social History  ? ?Tobacco Use  ? Smoking status: Never  ? Smokeless tobacco: Never  ?Substance Use Topics  ? Alcohol use: Not Currently  ? Drug use: Never  ? ? ?Allergies:  ?Allergies  ?Allergen Reactions  ? Cefzil [Cefprozil] Other (See Comments)  ?  Uknown  ? Penicillins Other (See Comments)  ?  Unknown  ? Sulfa Antibiotics Other (See Comments)  ?  Uknown reaction.  ? ? ?Medications Prior to Admission  ?Medication Sig Dispense Refill Last Dose  ?  acetaminophen (TYLENOL) 325 MG tablet Take 2 tablets (650 mg total) by mouth every 4 (four) hours as needed (for pain scale < 4). 30 tablet 0 01/25/2022 at 1300  ? benzocaine-Menthol (DERMOPLAST) 20-0.5 % AERO Apply 1 application topically as needed for irritation (perineal discomfort).     ? coconut oil OIL Apply 1 application topically as needed.  0   ? ibuprofen (ADVIL) 600 MG tablet Take 1 tablet (600 mg total) by mouth every 6 (six) hours. 30 tablet 0   ? Prenatal Vit-Fe Fumarate-FA (PRENATAL MULTIVITAMIN) TABS tablet Take 1 tablet by mouth at bedtime.      ? sertraline (ZOLOFT) 50 MG tablet Take 50 mg by mouth daily.     ? witch hazel-glycerin (TUCKS) pad Apply 1 application topically as needed for hemorrhoids. 40 each 12   ? ? ?Review of Systems  ?Gastrointestinal:  Positive for abdominal pain. Negative for nausea and vomiting.  ?Genitourinary:  Positive for vaginal bleeding. Negative for difficulty urinating and dysuria.  ?Neurological:  Positive for dizziness.  ?Physical Exam  ? ?Blood pressure 127/75, pulse 93, temperature 98.4 ?F (36.9 ?C),  temperature source Oral, resp. rate 16, last menstrual period 12/14/2021, SpO2 100 %, unknown if currently breastfeeding. ? ?Physical Exam ?Vitals reviewed.  ?Constitutional:   ?   General: She is not in acute distress. ?   Appearance: She is well-developed. She is not toxic-appearing.  ?HENT:  ?   Head: Normocephalic and atraumatic.  ?Cardiovascular:  ?   Rate and Rhythm: Normal rate.  ?Pulmonary:  ?   Effort: Pulmonary effort is normal. No respiratory distress.  ?Neurological:  ?   Mental Status: She is alert and oriented to person, place, and time.  ? ? ?MAU Course  ?Procedures ?Results for orders placed or performed during the hospital encounter of 01/25/22 (from the past 24 hour(s))  ?Glucose, capillary     Status: None  ? Collection Time: 01/25/22  9:17 PM  ?Result Value Ref Range  ? Glucose-Capillary 82 70 - 99 mg/dL  ? ?US OB LESS THAN 14 WEEKS WITH OB  TRANSVAGINAL ? ?Result Date: 01/25/2022 ?CLINICAL DATA:  Known left adnexal ectopic pregnancy. LMP: 12/14/2021 corresponding to an estimated gestational age of [redacted] weeks, 0 days. EXAM: OBSTETRIC <14 WK [place] AND TRANSVAGINAL OB US TECHNIQUE: Both transabdominal and transvaginal ultrasound examinations were performed for complete evaluation of the gestation as well as the maternal uterus, adnexal regions, and pelvic cul-de-sac. Transvaginal technique was performed to assess early pregnancy. COMPARISON:  None. FINDINGS: The uterus is anteverted and appears unremarkable. The endometrium measures 7 mm in thickness. No intrauterine pregnancy identified. The right ovary is unremarkable. There is a corpus luteum in the left ovary. There is a 3.6 x 2.9 x 2.6 cm mass in the left adnexa. There is heterogeneous echogenic content adjacent to this mass as well as moderate amount of complex fluid within the pelvis consistent with blood product. IMPRESSION: Known left adnexal ectopic pregnancy with evidence of rupture and moderate amount of blood product within the pelvis. These results were called by telephone at the time of interpretation on 01/25/2022 at 9:54 pm to nurse midwife, Shelly Coss, who verbally acknowledged these results. Electronically Signed   By: Elgie Collard M.D.   On: 01/25/2022 21:59   ? ? ?MDM ?Limited Exam ?Labs: CBC, T&S, hCG, CBG ?Start IV with LR Bolus  ?Ultrasound ?Assessment and Plan  ?36 year old  ?Known Ectopic Pregnancy ?S/P MTX on 3/23 ? ?-[place] in process upon provider arrival at [facility]. ?-POC Reviewed. ?-Patient offered and declines pain medication currently. ?-Will monitor and await results.  ?-Remain NPO ? ?Cherre Robins ?01/25/2022, 9:52 PM  ? ?Reassessment (9:57 PM) ? ?-Dr. Gwenyth Bender contacts provider and informs of ruptured known left ectopic. We appreciate him consulting and contacting provider. ?-Dr. Precious Haws contacted and informed of patient status, evaluation, interventions, and results. Advised: ?*Prepare  for OR. ?-Provider to bedside to inform patient who expresses disappointment, but relief that Dr. Precious Haws will be assuming care. ?-No questions. ?-Nurse reports labs not drawn as IV in place upon arrival. ?-Instructed to contact phlebotomy.  ?-Care relinquished. ? ?Cherre Robins MSN, CNM ?Advanced Practice Provider, [facility] ? ?

## 2022-01-25 NOTE — H&P (Addendum)
Kelli Perez is an 36 y.o. female  ?Chief Complaint  ?Patient presents with  ? Sharp abdominal Pain  ? ?Kelli PewMeghan Perez is a 36 y.o. [id number] at 5972w0d by LMP with known left adnexal ectopic pregnancy.  Patient reports she receives [facility].  She presents today, via EMS, for Abdominal Pain.  She states the pain started 2 hours after her injection of MTX and was initially a sharp shooting pain that gradually improved, but never resolved.  She states she then began to experience some abdominal sensitivity that caused increased pain with movement.  She states around 7 or 8 pm she started to feel dizzy, pale and feeling of passing out and her husband decided to contact EMS.  She states her pain is currently a 6-7/10.  Patient endorses vaginal bleeding and states it has been present since Saturday.  She reports she last ate, an apple sauce pouch, prior to EMS arrival. ? ?Abdominal Pain ?Pertinent negatives include no dysuria, nausea or vomiting.  ? ?OB History   ? ? Gravida  ?4  ? Para  ?2  ? Term  ?2  ? Preterm  ?   ? AB  ?1  ? Living  ?2  ?  ? ? SAB  ?1  ? IAB  ?   ? Ectopic  ?   ? Multiple  ?0  ? Live Births  ?2  ?   ?  ?  ? ? ?Past Medical History:  ?Diagnosis Date  ? Complication of anesthesia   ? Medical history non-contributory   ? PONV (postoperative nausea and vomiting)   ? SVD (spontaneous vaginal delivery) x2 05/22/2018 11/2020  ? ? ?Past Surgical History:  ?Procedure Laterality Date  ? SKIN SURGERY    ? birthmark removal on back  ? ? ?History reviewed. No pertinent family history. ? ?Social History  ? ?Tobacco Use  ? Smoking status: Never  ? Smokeless tobacco: Never  ?Substance Use Topics  ? Alcohol use: Not Currently  ? Drug use: Never  ? ? ?Allergies:  ?Allergies  ?Allergen Reactions  ? Cefzil [Cefprozil] Other (See Comments)  ?  Uknown  ? Penicillins Other (See Comments)  ?  Unknown  ? Sulfa Antibiotics Other (See Comments)  ?  Uknown reaction.  ? ? ?Medications Prior to Admission  ?Medication Sig  Dispense Refill Last Dose  ? acetaminophen (TYLENOL) 325 MG tablet Take 2 tablets (650 mg total) by mouth every 4 (four) hours as needed (for pain scale < 4). 30 tablet 0 01/25/2022 at 1300  ? benzocaine-Menthol (DERMOPLAST) 20-0.5 % AERO Apply 1 application topically as needed for irritation (perineal discomfort).     ? coconut oil OIL Apply 1 application topically as needed.  0   ? ibuprofen (ADVIL) 600 MG tablet Take 1 tablet (600 mg total) by mouth every 6 (six) hours. 30 tablet 0   ? Prenatal Vit-Fe Fumarate-FA (PRENATAL MULTIVITAMIN) TABS tablet Take 1 tablet by mouth at bedtime.      ? sertraline (ZOLOFT) 50 MG tablet Take 50 mg by mouth daily.     ? witch hazel-glycerin (TUCKS) pad Apply 1 application topically as needed for hemorrhoids. 40 each 12   ? ? ?Review of Systems  ?Gastrointestinal:  Positive for abdominal pain. Negative for nausea and vomiting.  ?Genitourinary:  Positive for vaginal bleeding. Negative for difficulty urinating and dysuria.  ?Neurological:  Positive for dizziness.  ?Physical Exam  ? ?Blood pressure 137/78, pulse (!) 112, temperature 98.4 ?F (36.9 ?  C), temperature source Oral, resp. rate 16, last menstrual period 12/14/2021, SpO2 99 %, unknown if currently breastfeeding. ? ?Physical Exam ?Vitals reviewed.  ?Constitutional:   ?   General: She is not in acute distress. ?   Appearance: She is well-developed. She is not toxic-appearing.  ?HENT:  ?   Head: Normocephalic and atraumatic.  ?Cardiovascular:  ?   Rate and Rhythm: Normal rate.  ?Pulmonary:  ?   Effort: Pulmonary effort is normal. No respiratory distress.  ?Neurological:  ?   Mental Status: She is alert and oriented to person, place, and time.  ?Abdomen:  ?  Guarding, mildly tender on gentle exam  ? ?CBC     Status: Abnormal  ? Collection Time: 01/25/22  9:51 PM  ?Result Value Ref Range  ? WBC 14.8 (H) 4.0 - 10.5 K/uL  ? RBC 4.24 3.87 - 5.11 MIL/uL  ? Hemoglobin 12.7 12.0 - 15.0 g/dL  ? HCT 38.4 36.0 - 46.0 %  ? MCV 90.6 80.0 -  100.0 fL  ? MCH 30.0 26.0 - 34.0 pg  ? MCHC 33.1 30.0 - 36.0 g/dL  ? RDW 12.6 11.5 - 15.5 %  ? Platelets 295 150 - 400 K/uL  ? nRBC 0.0 0.0 - 0.2 %  ?Type and screen     Status: None  ? Collection Time: 01/25/22  9:51 PM  ?Result Value Ref Range  ? ABO/RH(D) A POS   ? Antibody Screen NEG   ? Sample Expiration    ?  01/28/2022,2359 ?Performed at [facility], [address] [place], [place] [address] ?  ?hCG, quantitative, pregnancy     Status: Abnormal  ? Collection Time: 01/25/22  9:51 PM  ?Result Value Ref Range  ? hCG, Beta Chain, Quant, S 974 (H) <5 mIU/mL  ? ?US OB LESS THAN 14 WEEKS WITH OB TRANSVAGINAL ? ?Result Date: 01/25/2022 ?CLINICAL DATA:  Known left adnexal ectopic pregnancy. LMP: 12/14/2021 corresponding to an estimated gestational age of [redacted] weeks, 0 days. EXAM: OBSTETRIC <14 WK [place] AND TRANSVAGINAL OB US TECHNIQUE: Both transabdominal and transvaginal ultrasound examinations were performed for complete evaluation of the gestation as well as the maternal uterus, adnexal regions, and pelvic cul-de-sac. Transvaginal technique was performed to assess early pregnancy. COMPARISON:  None. FINDINGS: The uterus is anteverted and appears unremarkable. The endometrium measures 7 mm in thickness. No intrauterine pregnancy identified. The right ovary is unremarkable. There is a corpus luteum in the left ovary. There is a 3.6 x 2.9 x 2.6 cm mass in the left adnexa. There is heterogeneous echogenic content adjacent to this mass as well as moderate amount of complex fluid within the pelvis consistent with blood product. IMPRESSION: Known left adnexal ectopic pregnancy with evidence of rupture and moderate amount of blood product within the pelvis. These results were called by telephone at the time of interpretation on 01/25/2022 at 9:54 pm to nurse midwife, Shelly Coss, who verbally acknowledged these results. Electronically Signed   By: Elgie Collard M.D.   On: 01/25/2022 21:59   ? ? ?Assessment and Plan  ?36 year  old Known Ectopic Pregnancy in left adnexa, now ruptured with hemoperitoneum  ?S/P MTX on 3/23 ?Remain NPO ?OR called and informed to set up for laparoscopy and left salpingectomy ASAP due to hemoperitoneum  ? ?Patient counseled, she is nervous but ready. Reviewed laparoscopy, salpingectomy and risks/ complications, of surgery reviewed incl infection, bleeding, damage to internal organs including bladder, bowels, ureters, blood vessels, other risks from anesthesia,  VTE and delayed complications of any surgery, complications in future surgery reviewed.  ? ?Robley Fries ?01/25/2022, 11:01 PM  ? ?When I arrived to OR to check on status, I am being informed that there is a case going on and there is a level 1 trauma in OR needing to come back to OR, so I need to await the current case completion to have a team as Gyn call team who is our typical team is on vacation.  ?We discussed that patient is stable but is still in acute need for surgery and OR needs to mobilize teams as soon as possible as current stable status can change suddenly.  ? ?

## 2022-01-26 ENCOUNTER — Other Ambulatory Visit: Payer: Self-pay

## 2022-01-26 ENCOUNTER — Encounter (HOSPITAL_COMMUNITY): Payer: Self-pay | Admitting: Obstetrics & Gynecology

## 2022-01-26 DIAGNOSIS — O00102 Left tubal pregnancy without intrauterine pregnancy: Secondary | ICD-10-CM | POA: Diagnosis not present

## 2022-01-26 DIAGNOSIS — O009 Unspecified ectopic pregnancy without intrauterine pregnancy: Secondary | ICD-10-CM

## 2022-01-26 DIAGNOSIS — Z3A01 Less than 8 weeks gestation of pregnancy: Secondary | ICD-10-CM | POA: Diagnosis not present

## 2022-01-26 HISTORY — DX: Unspecified ectopic pregnancy without intrauterine pregnancy: O00.90

## 2022-01-26 MED ORDER — IBUPROFEN 200 MG PO TABS: 200.0000 mg | ORAL_TABLET | Freq: Four times a day (QID) | ORAL | 0 refills | Status: DC | PRN

## 2022-01-26 MED ORDER — ACETAMINOPHEN 325 MG PO TABS: 325.0000 mg | ORAL_TABLET | ORAL | Status: DC | PRN

## 2022-01-26 MED ORDER — ACETAMINOPHEN 10 MG/ML IV SOLN
1000.0000 mg | Freq: Once | INTRAVENOUS | Status: DC | PRN
  Administered 2022-01-26: 1000 mg via INTRAVENOUS

## 2022-01-26 MED ORDER — AMISULPRIDE (ANTIEMETIC) 5 MG/2ML IV SOLN
10.0000 mg | Freq: Once | INTRAVENOUS | Status: AC | PRN
  Administered 2022-01-26: 10 mg via INTRAVENOUS

## 2022-01-26 MED ORDER — MIDAZOLAM HCL 5 MG/5ML IJ SOLN
INTRAMUSCULAR | Status: DC | PRN
  Administered 2022-01-25: 2 mg via INTRAVENOUS

## 2022-01-26 MED ORDER — SUGAMMADEX SODIUM 200 MG/2ML IV SOLN
INTRAVENOUS | Status: DC | PRN
  Administered 2022-01-26: 300 mg via INTRAVENOUS

## 2022-01-26 MED ORDER — ROCURONIUM BROMIDE 100 MG/10ML IV SOLN
INTRAVENOUS | Status: DC | PRN
  Administered 2022-01-26: 60 mg via INTRAVENOUS

## 2022-01-26 MED ORDER — HYDROMORPHONE HCL 2 MG PO TABS: 1.0000 mg | ORAL_TABLET | Freq: Four times a day (QID) | ORAL | 0 refills | Status: AC | PRN

## 2022-01-26 MED ORDER — SUCCINYLCHOLINE CHLORIDE 200 MG/10ML IV SOSY
PREFILLED_SYRINGE | INTRAVENOUS | Status: DC | PRN
  Administered 2022-01-25: 140 mg via INTRAVENOUS

## 2022-01-26 MED ORDER — ACETAMINOPHEN 10 MG/ML IV SOLN
INTRAVENOUS | Status: AC
  Filled 2022-01-26: qty 100

## 2022-01-26 MED ORDER — ONDANSETRON HCL 4 MG/2ML IJ SOLN
INTRAMUSCULAR | Status: DC | PRN
  Administered 2022-01-25: 4 mg via INTRAVENOUS

## 2022-01-26 MED ORDER — SODIUM CHLORIDE 0.9 % IR SOLN
Status: DC | PRN
  Administered 2022-01-26: 1000 mL

## 2022-01-26 MED ORDER — LIDOCAINE HCL (CARDIAC) PF 100 MG/5ML IV SOSY
PREFILLED_SYRINGE | INTRAVENOUS | Status: DC | PRN
  Administered 2022-01-25: 40 mg via INTRAVENOUS

## 2022-01-26 MED ORDER — AMISULPRIDE (ANTIEMETIC) 5 MG/2ML IV SOLN
INTRAVENOUS | Status: AC
  Filled 2022-01-26: qty 4

## 2022-01-26 MED ORDER — SCOPOLAMINE 1 MG/3DAYS TD PT72
MEDICATED_PATCH | TRANSDERMAL | Status: DC | PRN
  Administered 2022-01-25: 1 via TRANSDERMAL

## 2022-01-26 MED ORDER — FENTANYL CITRATE (PF) 100 MCG/2ML IJ SOLN
INTRAMUSCULAR | Status: AC
  Filled 2022-01-26: qty 2

## 2022-01-26 MED ORDER — ACETAMINOPHEN 160 MG/5ML PO SOLN: 325.0000 mg | ORAL | Status: DC | PRN

## 2022-01-26 MED ORDER — DEXAMETHASONE SODIUM PHOSPHATE 10 MG/ML IJ SOLN
INTRAMUSCULAR | Status: DC | PRN
  Administered 2022-01-25: 10 mg via INTRAVENOUS

## 2022-01-26 MED ORDER — ACETAMINOPHEN 500 MG PO TABS: 1000.0000 mg | ORAL_TABLET | Freq: Four times a day (QID) | ORAL | 0 refills | Status: DC | PRN

## 2022-01-26 MED ORDER — 0.9 % SODIUM CHLORIDE (POUR BTL) OPTIME
TOPICAL | Status: DC | PRN
  Administered 2022-01-26: 1000 mL

## 2022-01-26 MED ORDER — LACTATED RINGERS IV SOLN: INTRAVENOUS | Status: DC | PRN

## 2022-01-26 MED ORDER — OXYCODONE HCL 5 MG PO TABS: 5.0000 mg | ORAL_TABLET | Freq: Once | ORAL | Status: DC | PRN

## 2022-01-26 MED ORDER — OXYCODONE HCL 5 MG/5ML PO SOLN: 5.0000 mg | Freq: Once | ORAL | Status: DC | PRN

## 2022-01-26 MED ORDER — FENTANYL CITRATE (PF) 100 MCG/2ML IJ SOLN
INTRAMUSCULAR | Status: DC | PRN
  Administered 2022-01-25: 100 ug via INTRAVENOUS
  Administered 2022-01-26: 50 ug via INTRAVENOUS
  Administered 2022-01-26: 100 ug via INTRAVENOUS

## 2022-01-26 MED ORDER — BUPIVACAINE HCL (PF) 0.25 % IJ SOLN
INTRAMUSCULAR | Status: DC | PRN
Start: 2022-01-26 — End: 2022-01-26
  Administered 2022-01-26: 20 mL

## 2022-01-26 MED ORDER — FENTANYL CITRATE (PF) 100 MCG/2ML IJ SOLN: 25.0000 ug | INTRAMUSCULAR | Status: DC | PRN

## 2022-01-26 MED ORDER — PROPOFOL 10 MG/ML IV BOLUS
INTRAVENOUS | Status: DC | PRN
  Administered 2022-01-25: 150 mg via INTRAVENOUS

## 2022-01-26 NOTE — Anesthesia Procedure Notes (Signed)
Procedure Name: Intubation ?Date/Time: 01/25/2022 11:51 PM ?Performed by: Penni Penado T, CRNA ?Pre-anesthesia Checklist: Patient identified, Emergency Drugs available, Suction available and Patient being monitored ?Patient Re-evaluated:Patient Re-evaluated prior to induction ?Oxygen Delivery Method: Circle system utilized ?Preoxygenation: Pre-oxygenation with 100% oxygen ?Induction Type: IV induction, Rapid sequence and Cricoid Pressure applied ?Laryngoscope Size: Sabra Heck and 3 ?Grade View: Grade I ?Tube type: Oral ?Tube size: 7.5 mm ?Number of attempts: 1 ?Airway Equipment and Method: Stylet ?Placement Confirmation: ETT inserted through vocal cords under direct vision, positive ETCO2 and breath sounds checked- equal and bilateral ?Secured at: 22 cm ?Tube secured with: Tape ?Dental Injury: Teeth and Oropharynx as per pre-operative assessment  ? ? ? ? ?

## 2022-01-26 NOTE — Op Note (Signed)
01/26/2022 ?Kelli Perez  ? ?Preoperative diagnosis: Left adnexal ruptured ectopic pregnancy with hemoperitoneum ?Postoperative diagnosis: Same, ruptured left tubal ectopic pregnancy with hemoperitoneum ?Procedure: Laparoscopic left salpingectomy  ?Surgeon: Dr Shea Evans, MD ?Assistants: none ?Anesthesia Gen. Endotracheal ?IV fluids LR  15\00 cc LR ?EBL minimal  10 cc for surgery and 200 cc clots in pelvis  ?Urine clear  200 cc drained by straight cath before starting surgery ?Complications none ?Disposition PACU and home ?Specimens  Left fallopian tube with ectopic pregnancy  ? ?Procedure ?Patient is diagnosed with ruptured left tubal pregnancy, failed methotrexate therapy.  ?She was advised to have surgery, laparoscopy and salpingectomy. Risk and complications of surgery including infection, bleeding, damage to internal organs, other complications including pneumonia, VTE were reviewed. Informed written consent was obtained and patient was brought to the operating room with IV running. Timeout was carried out. Antibiotic was not indicated.   ?She underwent general anesthesia without difficulty and was given dorsal lithotomy position. She was prepped and draped in standard fashion and bladder emptied with straight cath. Uterine manipulation was inserted and secured with balloon.  ?Gown gloves changes and attention was focussed on the abdomen. 0.25% Marcaine injected in the skin at the lower edge of the umbilicus and a 10 mm incision made. Fascia grasped and incised. Peritoneum entered and confirmed. Hassan canula with balloon placed, balloon inflated. Pneumoperitoneum created.  ?Hemoperitoneum noted. 2 lower incisions made after marcaine injection, with 5 mm trocar each under vision. Hemoperitoneum aspirated to assess pelvis. Trendelenburg given. Left tube noted with ectopic pregnancy in ampullary area and clots were noted around and under. Left ovary was normal. Right tube normal, right ovary were normal. Using  Ligasure, left mesosalpinx was desiccated and cut to perform complete salpingectomy. Irrigation done. Cut edge appeared hemostatic.  ?Endobag placed from central port and laparoscope from left lower port and left fallopian tube with ectopic were dropped in endobag, some clots were also dropped in endobag and it was removed from central port intact.  ?Hemoperitoneum aspirated.   ?All ports removed under vision and pneumperitoneum released. Central port fascia closed with 0-Vicryl and all skin incisions closed with 4-0 Vicryl. Dressing placed in central port and Dermabond on lower incisions. Uterine manipulator was removed.  ? ?All counts were correct x2 . patient brought to PACU after extubation.  ?Plan is to discharge home from PACU. Surgical findings were discussed with patient's family. ?I performed the surgery.  ?  ?Shea Evans, MD   ?  ?  ?  ?

## 2022-01-26 NOTE — Anesthesia Postprocedure Evaluation (Signed)
Anesthesia Post Note ? ?Patient: Kelli Perez ? ?Procedure(s) Performed: DIAGNOSTIC LAPAROSCOPY WITH REMOVAL OF ECTOPIC PREGNANCY ? ?  ? ?Patient location during evaluation: PACU ?Anesthesia Type: General ?Level of consciousness: awake and alert ?Pain management: pain level controlled ?Vital Signs Assessment: post-procedure vital signs reviewed and stable ?Respiratory status: spontaneous breathing, nonlabored ventilation, respiratory function stable and patient connected to nasal cannula oxygen ?Cardiovascular status: blood pressure returned to baseline and stable ?Postop Assessment: no apparent nausea or vomiting ?Anesthetic complications: no ? ? ?No notable events documented. ? ?Last Vitals:  ?Vitals:  ? 01/26/22 0145 01/26/22 0200  ?BP: 140/72 129/71  ?Pulse: (!) 107 98  ?Resp: 17 (!) 21  ?Temp:  36.8 ?C  ?SpO2: 93% 94%  ?  ?Last Pain:  ?Vitals:  ? 01/26/22 0200  ?TempSrc:   ?PainSc: 0-No pain  ? ? ?  ?  ?  ?  ?  ?  ? ?Effie Berkshire ? ? ? ? ?

## 2022-01-26 NOTE — Transfer of Care (Signed)
Immediate Anesthesia Transfer of Care Note ? ?Patient: Kelli Perez ? ?Procedure(s) Performed: DIAGNOSTIC LAPAROSCOPY WITH REMOVAL OF ECTOPIC PREGNANCY ? ?Patient Location: PACU ? ?Anesthesia Type:General ? ?Level of Consciousness: awake, alert  and oriented ? ?Airway & Oxygen Therapy: Patient Spontanous Breathing and Patient connected to nasal cannula oxygen ? ?Post-op Assessment: Report given to RN, Post -op Vital signs reviewed and stable and Patient moving all extremities ? ?Post vital signs: Reviewed and stable ? ?Last Vitals:  ?Vitals Value Taken Time  ?BP 130/81 01/26/22 0126  ?Temp 36.8 ?C 01/26/22 0126  ?Pulse 112 01/26/22 0128  ?Resp 17 01/26/22 0128  ?SpO2 95 % 01/26/22 0128  ?Vitals shown include unvalidated device data. ? ?Last Pain:  ?Vitals:  ? 01/25/22 2050  ?TempSrc: Oral  ?   ? ?  ? ?Complications: No notable events documented. ?

## 2022-01-28 DIAGNOSIS — R42 Dizziness and giddiness: Secondary | ICD-10-CM | POA: Insufficient documentation

## 2022-01-29 DIAGNOSIS — R42 Dizziness and giddiness: Secondary | ICD-10-CM | POA: Diagnosis not present

## 2022-01-29 DIAGNOSIS — R197 Diarrhea, unspecified: Secondary | ICD-10-CM | POA: Diagnosis not present

## 2022-01-29 LAB — SURGICAL PATHOLOGY

## 2022-06-12 DIAGNOSIS — J358 Other chronic diseases of tonsils and adenoids: Secondary | ICD-10-CM | POA: Diagnosis not present

## 2022-06-12 DIAGNOSIS — J0100 Acute maxillary sinusitis, unspecified: Secondary | ICD-10-CM | POA: Diagnosis not present

## 2022-06-12 DIAGNOSIS — J029 Acute pharyngitis, unspecified: Secondary | ICD-10-CM | POA: Diagnosis not present

## 2022-07-06 DIAGNOSIS — J0100 Acute maxillary sinusitis, unspecified: Secondary | ICD-10-CM | POA: Diagnosis not present

## 2022-07-11 DIAGNOSIS — J0100 Acute maxillary sinusitis, unspecified: Secondary | ICD-10-CM | POA: Diagnosis not present

## 2022-07-29 DIAGNOSIS — J329 Chronic sinusitis, unspecified: Secondary | ICD-10-CM | POA: Diagnosis not present

## 2022-08-23 DIAGNOSIS — J329 Chronic sinusitis, unspecified: Secondary | ICD-10-CM | POA: Diagnosis not present

## 2022-08-26 DIAGNOSIS — G44049 Chronic paroxysmal hemicrania, not intractable: Secondary | ICD-10-CM | POA: Diagnosis not present

## 2022-08-26 DIAGNOSIS — J309 Allergic rhinitis, unspecified: Secondary | ICD-10-CM | POA: Diagnosis not present

## 2023-01-15 ENCOUNTER — Ambulatory Visit (INDEPENDENT_AMBULATORY_CARE_PROVIDER_SITE_OTHER): Payer: BC Managed Care – PPO | Admitting: Physician Assistant

## 2023-01-15 ENCOUNTER — Encounter: Payer: Self-pay | Admitting: *Deleted

## 2023-01-15 VITALS — BP 130/68 | HR 113 | Temp 97.8°F | Ht 68.0 in | Wt 208.2 lb

## 2023-01-15 DIAGNOSIS — F3281 Premenstrual dysphoric disorder: Secondary | ICD-10-CM | POA: Diagnosis not present

## 2023-01-15 DIAGNOSIS — E559 Vitamin D deficiency, unspecified: Secondary | ICD-10-CM | POA: Diagnosis not present

## 2023-01-15 DIAGNOSIS — R5383 Other fatigue: Secondary | ICD-10-CM

## 2023-01-15 LAB — CBC WITH DIFFERENTIAL/PLATELET
Basophils Absolute: 0 10*3/uL (ref 0.0–0.1)
Basophils Relative: 0.7 % (ref 0.0–3.0)
Eosinophils Absolute: 0.1 10*3/uL (ref 0.0–0.7)
Eosinophils Relative: 2.4 % (ref 0.0–5.0)
HCT: 43.5 % (ref 36.0–46.0)
Hemoglobin: 14.8 g/dL (ref 12.0–15.0)
Lymphocytes Relative: 31.8 % (ref 12.0–46.0)
Lymphs Abs: 1.9 10*3/uL (ref 0.7–4.0)
MCHC: 33.9 g/dL (ref 30.0–36.0)
MCV: 89.8 fl (ref 78.0–100.0)
Monocytes Absolute: 0.3 10*3/uL (ref 0.1–1.0)
Monocytes Relative: 5.5 % (ref 3.0–12.0)
Neutro Abs: 3.6 10*3/uL (ref 1.4–7.7)
Neutrophils Relative %: 59.6 % (ref 43.0–77.0)
Platelets: 339 10*3/uL (ref 150.0–400.0)
RBC: 4.84 Mil/uL (ref 3.87–5.11)
RDW: 13.2 % (ref 11.5–15.5)
WBC: 6.1 10*3/uL (ref 4.0–10.5)

## 2023-01-15 LAB — COMPREHENSIVE METABOLIC PANEL
ALT: 27 U/L (ref 0–35)
AST: 17 U/L (ref 0–37)
Albumin: 4.4 g/dL (ref 3.5–5.2)
Alkaline Phosphatase: 56 U/L (ref 39–117)
BUN: 16 mg/dL (ref 6–23)
CO2: 27 mEq/L (ref 19–32)
Calcium: 9.6 mg/dL (ref 8.4–10.5)
Chloride: 103 mEq/L (ref 96–112)
Creatinine, Ser: 0.91 mg/dL (ref 0.40–1.20)
GFR: 81.02 mL/min (ref >60.00)
Glucose, Bld: 81 mg/dL (ref 70–99)
Potassium: 4.1 mEq/L (ref 3.5–5.1)
Sodium: 138 mEq/L (ref 135–145)
Total Bilirubin: 0.4 mg/dL (ref 0.2–1.2)
Total Protein: 7.1 g/dL (ref 6.0–8.3)

## 2023-01-15 LAB — TSH: TSH: 1.53 u[IU]/mL (ref 0.35–5.50)

## 2023-01-15 LAB — VITAMIN B12: Vitamin B-12: 653 pg/mL (ref 211–911)

## 2023-01-15 LAB — VITAMIN D 25 HYDROXY (VIT D DEFICIENCY, FRACTURES): VITD: 16.63 ng/mL — ABNORMAL LOW (ref 30.00–100.00)

## 2023-01-15 NOTE — Progress Notes (Signed)
Subjective:    Patient ID: Kelli Perez, female    DOB: May 16, 1986, 37 y.o.   MRN: [id number]  Chief Complaint  Patient presents with   Establish Care    Mood issue related to hormonal changes    HPI 37 y.o. patient presents today for new patient establishment with me.  Boys are currently 2 & 4, both stay home with her; husband works full-time.   Current Care Team: OB/GYN   Concerns: Hx of PMS / PMDD - worse now; seems unmanageable; two weeks per months, struggling to get motivated and focused; angry, irritable, doesn't feel like herself at all; trying vitamins / supplements; Zoloft 50 mg (has helped anxiety / ruminating thoughts); Still currently breastfeeding mostly at night, wanting to wean but hasn't yet.   End of the month - appt with OB/GYN - wants to discuss birth control options.  Wanting labs checked today   Past Medical History:  Diagnosis Date   Anemia    Anxiety    Asthma    Complication of anesthesia    Ectopic pregnancy without intrauterine pregnancy 01/26/2022   GERD (gastroesophageal reflux disease)    Medical history non-contributory    PONV (postoperative nausea and vomiting)    Seizures (HCC)    SVD (spontaneous vaginal delivery) 05/22/2018    Past Surgical History:  Procedure Laterality Date   DIAGNOSTIC LAPAROSCOPY WITH REMOVAL OF ECTOPIC PREGNANCY N/A 01/25/2022   Procedure: DIAGNOSTIC LAPAROSCOPY WITH REMOVAL OF ECTOPIC PREGNANCY;  Surgeon: Azucena Fallen, MD;  Location: [facility];  Service: Gynecology;  Laterality: N/A;   SKIN SURGERY     birthmark removal on back    History reviewed. No pertinent family history.  Social History   Tobacco Use   Smoking status: Never   Smokeless tobacco: Never  Substance Use Topics   Alcohol use: Not Currently   Drug use: Never     Allergies  Allergen Reactions   Cefzil [Cefprozil] Other (See Comments)    Uknown   Penicillins Other (See Comments)    Unknown   Sulfa Antibiotics Other (See Comments)     Uknown reaction.    Review of Systems NEGATIVE UNLESS OTHERWISE INDICATED IN HPI      Objective:     BP 130/68   Pulse (!) 113   Temp 97.8 F (36.6 C)   Ht '5\' 8"'$  (1.727 m)   Wt 208 lb 3.2 oz (94.4 kg)   LMP 01/13/2023 (Exact Date)   SpO2 98%   Breastfeeding Yes   BMI 31.66 kg/m   Wt Readings from Last 3 Encounters:  01/15/23 208 lb 3.2 oz (94.4 kg)  01/24/22 201 lb (91.2 kg)  11/09/20 211 lb (95.7 kg)    BP Readings from Last 3 Encounters:  01/15/23 130/68  01/26/22 129/71  01/24/22 (!) 141/86     Physical Exam Vitals and nursing note reviewed.  Constitutional:      Appearance: Normal appearance.  Eyes:     Extraocular Movements: Extraocular movements intact.     Conjunctiva/sclera: Conjunctivae normal.     Pupils: Pupils are equal, round, and reactive to light.  Cardiovascular:     Rate and Rhythm: Normal rate and regular rhythm.  Pulmonary:     Effort: Pulmonary effort is normal.     Breath sounds: Normal breath sounds.  Neurological:     General: No focal deficit present.     Mental Status: She is alert and oriented to person, place, and time.  Psychiatric:  Mood and Affect: Mood normal.        Behavior: Behavior normal.        Thought Content: Thought content normal.        Judgment: Judgment normal.        Assessment & Plan:  PMDD (premenstrual dysphoric disorder) -     VITAMIN D 25 Hydroxy (Vit-D Deficiency, Fractures) -     Vitamin B12 -     TSH -     Comprehensive metabolic panel -     CBC with Differential/Platelet  Other fatigue -     VITAMIN D 25 Hydroxy (Vit-D Deficiency, Fractures) -     Vitamin B12 -     TSH -     Comprehensive metabolic panel -     CBC with Differential/Platelet  Vitamin D deficiency -     VITAMIN D 25 Hydroxy (Vit-D Deficiency, Fractures)   New patient establishing care. Will check labs today.  Options for PMDD - (not an exhaustive list, but things to consider) -  -Increase Zoloft to 75 or  100 mg during the 10 day window prior to cycle -Start on daily NSAIDs in that time period -Consider starting progesterone-only OCP -Start the weaning process -Walking / stretch / mindful nutrition  -Limit stressful conversations     Return in about 3 months (around 04/17/2023) for recheck.    Kelli Mohamed M Marissah Vandemark, PA-C

## 2023-01-15 NOTE — Patient Instructions (Signed)
Welcome to [facility]! It was a pleasure meeting you today.  As discussed, Please schedule a 3 month follow up visit today to recheck how you're doing.  Options for PMDD - (not an exhaustive list, but things to consider) -  -Increase Zoloft to 75 or 100 mg during the 10 day window prior to cycle -Start on daily NSAIDs in that time period -Consider starting progesterone-only OCP -Start the weaning process -Walking / stretch / mindful nutrition  -Limit stressful conversations  PLEASE NOTE:  If you had any LAB tests please let us know if you have not heard back within a few days. You may see your results on MyChart before we have a chance to review them but we will give you a call once they are reviewed by [place]. If we ordered any REFERRALS today, please let us know if you have not heard from their office within the next two weeks. Let us know through MyChart if you are needing REFILLS, or have your pharmacy send [place] the request. You can also use MyChart to communicate with me or any office staff.  Please try these tips to maintain a healthy lifestyle:  Eat most of your calories during the day when you are active. Eliminate processed foods including packaged sweets (pies, cakes, cookies), reduce intake of potatoes, white bread, white pasta, and white rice. Look for whole grain options, oat flour or almond flour.  Each meal should contain half fruits/vegetables, one quarter protein, and one quarter carbs (no bigger than a computer mouse).  Cut down on sweet beverages. This includes juice, soda, and sweet tea. Also watch fruit intake, though this is a healthier sweet option, it still contains natural sugar! Limit to 3 servings daily.  Drink at least 1 glass of water with each meal and aim for at least 8 glasses (64 ounces) per day.  Exercise at least 150 minutes every week to the best of your ability.    Take Care,  Dinero Chavira, PA-C

## 2023-01-16 ENCOUNTER — Other Ambulatory Visit: Payer: Self-pay

## 2023-01-16 ENCOUNTER — Ambulatory Visit: Payer: BC Managed Care – PPO | Admitting: Family

## 2023-01-16 MED ORDER — VITAMIN D (ERGOCALCIFEROL) 1.25 MG (50000 UNIT) PO CAPS: 50000.0000 [IU] | ORAL_CAPSULE | ORAL | 0 refills | Status: DC

## 2023-01-21 ENCOUNTER — Encounter: Payer: Self-pay | Admitting: Physician Assistant

## 2023-01-22 NOTE — Telephone Encounter (Signed)
Please see pt msg and advise 

## 2023-01-30 DIAGNOSIS — Z01419 Encounter for gynecological examination (general) (routine) without abnormal findings: Secondary | ICD-10-CM | POA: Diagnosis not present

## 2023-01-30 DIAGNOSIS — Z124 Encounter for screening for malignant neoplasm of cervix: Secondary | ICD-10-CM | POA: Diagnosis not present

## 2023-02-05 LAB — HM PAP SMEAR
HM Pap smear: NEGATIVE
HPV, high-risk: NEGATIVE

## 2023-03-25 DIAGNOSIS — R499 Unspecified voice and resonance disorder: Secondary | ICD-10-CM | POA: Diagnosis not present

## 2023-03-25 DIAGNOSIS — B349 Viral infection, unspecified: Secondary | ICD-10-CM | POA: Diagnosis not present

## 2023-03-26 ENCOUNTER — Ambulatory Visit: Payer: Medicaid Other | Admitting: Physician Assistant

## 2023-04-07 ENCOUNTER — Other Ambulatory Visit: Payer: Self-pay | Admitting: Physician Assistant

## 2023-04-17 ENCOUNTER — Ambulatory Visit: Payer: BC Managed Care – PPO | Admitting: Physician Assistant

## 2023-04-17 VITALS — BP 120/80 | HR 105 | Temp 97.3°F | Ht 68.0 in | Wt 215.2 lb

## 2023-04-17 DIAGNOSIS — R5383 Other fatigue: Secondary | ICD-10-CM

## 2023-04-17 DIAGNOSIS — F3281 Premenstrual dysphoric disorder: Secondary | ICD-10-CM | POA: Insufficient documentation

## 2023-04-17 DIAGNOSIS — E559 Vitamin D deficiency, unspecified: Secondary | ICD-10-CM | POA: Diagnosis not present

## 2023-04-17 MED ORDER — VITAMIN D (ERGOCALCIFEROL) 1.25 MG (50000 UNIT) PO CAPS: 50000.0000 [IU] | ORAL_CAPSULE | ORAL | 0 refills | Status: DC

## 2023-04-17 MED ORDER — SERTRALINE HCL 50 MG PO TABS: 50.0000 mg | ORAL_TABLET | Freq: Every day | ORAL | 1 refills | Status: DC

## 2023-04-17 NOTE — Progress Notes (Signed)
Subjective:    Patient ID: Kelli Perez, female    DOB: 10-Oct-1986, 37 y.o.   MRN: [id number]  Chief Complaint  Patient presents with   Medical Management of Chronic Issues    Pt in office for 3 mon f/u with PMDD; pt feels like energy is lacking again, when taking Vitamin D felt getting better then ran out; not taking any OTC Vitamin D supplements; pt admits to getting dizzy spells at times, especially when skipping meals cutting sugar etc.     HPI Patient is in today for recheck on PMDD and fatigue.   Almost completely weaned her 37 yo from breastfeeding. Down to one feeding in the evenings.  Continues to track to her cycle, very regular per patient. Met with GYN, suggested OCPs, states she declined and wanted to wean first and see if that helps.   Still taking Zoloft 50 mg daily. Increased to 75 mg during week prior to cycle and felt like it helped except for day just before cycle.  High-dose Vit D seemed to help her energy levels, but then had the flu last month and energy has gone down again.    Past Medical History:  Diagnosis Date   Anemia    Anxiety    Asthma    Complication of anesthesia    Ectopic pregnancy without intrauterine pregnancy 01/26/2022   GERD (gastroesophageal reflux disease)    Medical history non-contributory    PONV (postoperative nausea and vomiting)    Seizures (HCC)    SVD (spontaneous vaginal delivery) 05/22/2018    Past Surgical History:  Procedure Laterality Date   DIAGNOSTIC LAPAROSCOPY WITH REMOVAL OF ECTOPIC PREGNANCY N/A 01/25/2022   Procedure: DIAGNOSTIC LAPAROSCOPY WITH REMOVAL OF ECTOPIC PREGNANCY;  Surgeon: Shea Evans, MD;  Location: [facility] OR;  Service: Gynecology;  Laterality: N/A;   SKIN SURGERY     birthmark removal on back    No family history on file.  Social History   Tobacco Use   Smoking status: Never   Smokeless tobacco: Never  Substance Use Topics   Alcohol use: Not Currently   Drug use: Never     Allergies   Allergen Reactions   Cefzil [Cefprozil] Other (See Comments)    Uknown   Penicillins Other (See Comments)    Unknown   Sulfa Antibiotics Other (See Comments)    Uknown reaction.    Review of Systems NEGATIVE UNLESS OTHERWISE INDICATED IN HPI      Objective:     BP 120/80 (BP Location: Left Arm)   Pulse (!) 105   Temp (!) 97.3 F (36.3 C) (Temporal)   Ht 5\' 8"  (1.727 m)   Wt 215 lb 3.2 oz (97.6 kg)   LMP 04/09/2023 (Exact Date)   SpO2 97%   BMI 32.72 kg/m   Wt Readings from Last 3 Encounters:  04/17/23 215 lb 3.2 oz (97.6 kg)  01/15/23 208 lb 3.2 oz (94.4 kg)  01/24/22 201 lb (91.2 kg)    BP Readings from Last 3 Encounters:  04/17/23 120/80  01/15/23 130/68  01/26/22 129/71     Physical Exam Vitals and nursing note reviewed.  Constitutional:      Appearance: Normal appearance.  Eyes:     Extraocular Movements: Extraocular movements intact.     Conjunctiva/sclera: Conjunctivae normal.     Pupils: Pupils are equal, round, and reactive to light.  Cardiovascular:     Rate and Rhythm: Normal rate and regular rhythm.  Pulmonary:     Effort:  Pulmonary effort is normal.     Breath sounds: Normal breath sounds.  Neurological:     General: No focal deficit present.     Mental Status: She is alert and oriented to person, place, and time.  Psychiatric:        Mood and Affect: Mood normal.        Behavior: Behavior normal.        Thought Content: Thought content normal.        Judgment: Judgment normal.        Assessment & Plan:  PMDD (premenstrual dysphoric disorder) Assessment & Plan: Stable, doing well with Sertraline 50 mg daily. Will continue to wean from breastfeeding and let me know how she's doing. Encouraged regular physical activity and good nutrition.   Orders: -     Sertraline HCl; Take 1 tablet (50 mg total) by mouth daily.  Dispense: 90 tablet; Refill: 1  Other fatigue -     Vitamin D (Ergocalciferol); Take 1 capsule (50,000 Units total) by  mouth every 7 (seven) days.  Dispense: 12 capsule; Refill: 0  Vitamin D deficiency Assessment & Plan: Refilled 50,000 iu dose qweekly.  Recheck labs at next visit.   Orders: -     Vitamin D (Ergocalciferol); Take 1 capsule (50,000 Units total) by mouth every 7 (seven) days.  Dispense: 12 capsule; Refill: 0        Return in about 6 months (around 10/17/2023) for recheck/follow-up.    Munachimso Palin M Keirra Zeimet, PA-C

## 2023-04-17 NOTE — Assessment & Plan Note (Signed)
Stable, doing well with Sertraline 50 mg daily. Will continue to wean from breastfeeding and let me know how she's doing. Encouraged regular physical activity and good nutrition.

## 2023-04-17 NOTE — Assessment & Plan Note (Signed)
Refilled 50,000 iu dose qweekly.  Recheck labs at next visit.

## 2023-04-29 ENCOUNTER — Other Ambulatory Visit (HOSPITAL_BASED_OUTPATIENT_CLINIC_OR_DEPARTMENT_OTHER): Payer: Self-pay

## 2023-04-29 DIAGNOSIS — F338 Other recurrent depressive disorders: Secondary | ICD-10-CM | POA: Diagnosis not present

## 2023-04-29 DIAGNOSIS — F419 Anxiety disorder, unspecified: Secondary | ICD-10-CM | POA: Diagnosis not present

## 2023-04-29 DIAGNOSIS — R41840 Attention and concentration deficit: Secondary | ICD-10-CM | POA: Diagnosis not present

## 2023-04-29 MED ORDER — LISDEXAMFETAMINE DIMESYLATE 20 MG PO CAPS
20.0000 mg | ORAL_CAPSULE | Freq: Every day | ORAL | 0 refills | Status: DC
  Filled 2023-04-29: qty 30, 30d supply, fill #0

## 2023-04-30 DIAGNOSIS — F902 Attention-deficit hyperactivity disorder, combined type: Secondary | ICD-10-CM | POA: Diagnosis not present

## 2023-05-26 ENCOUNTER — Other Ambulatory Visit (HOSPITAL_BASED_OUTPATIENT_CLINIC_OR_DEPARTMENT_OTHER): Payer: Self-pay

## 2023-05-26 MED ORDER — LISDEXAMFETAMINE DIMESYLATE 20 MG PO CAPS
20.0000 mg | ORAL_CAPSULE | Freq: Every day | ORAL | 0 refills | Status: DC
  Filled 2023-05-26 – 2023-05-29 (×2): qty 30, 30d supply, fill #0

## 2023-05-29 ENCOUNTER — Other Ambulatory Visit (HOSPITAL_BASED_OUTPATIENT_CLINIC_OR_DEPARTMENT_OTHER): Payer: Self-pay

## 2023-06-04 DIAGNOSIS — F419 Anxiety disorder, unspecified: Secondary | ICD-10-CM | POA: Diagnosis not present

## 2023-06-04 DIAGNOSIS — F902 Attention-deficit hyperactivity disorder, combined type: Secondary | ICD-10-CM | POA: Diagnosis not present

## 2023-06-04 DIAGNOSIS — Z79899 Other long term (current) drug therapy: Secondary | ICD-10-CM | POA: Diagnosis not present

## 2023-06-10 ENCOUNTER — Other Ambulatory Visit (HOSPITAL_BASED_OUTPATIENT_CLINIC_OR_DEPARTMENT_OTHER): Payer: Self-pay

## 2023-06-10 MED ORDER — LISDEXAMFETAMINE DIMESYLATE 40 MG PO CAPS
40.0000 mg | ORAL_CAPSULE | Freq: Every day | ORAL | 0 refills | Status: DC
  Filled 2023-06-10: qty 30, 30d supply, fill #0

## 2023-07-04 IMAGING — US US OB < 14 WEEKS - US OB TV
1 series · 15 of 28 positions shown · non-contrast
Comparison: None.

CLINICAL DATA: Known left adnexal ectopic pregnancy. LMP:
12/14/2021 corresponding to an estimated gestational age of 6 weeks,
0 days.

EXAM:
OBSTETRIC <14 WK US AND TRANSVAGINAL OB US
TECHNIQUE: Both transabdominal and transvaginal ultrasound examinations were
performed for complete evaluation of the gestation as well as the
maternal uterus, adnexal regions, and pelvic cul-de-sac.
Transvaginal technique was performed to assess early pregnancy.

[Series 1: us ob < 14 weeks - us ob tv · 15 of 72 slices shown]
[im 1/72]
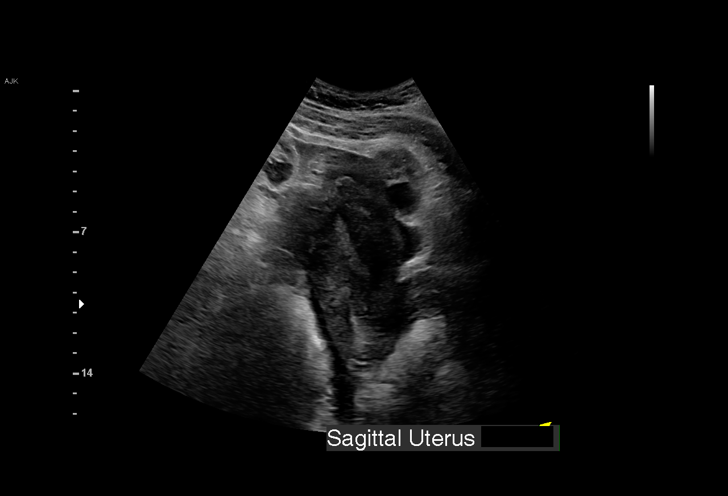
[im 6/72]
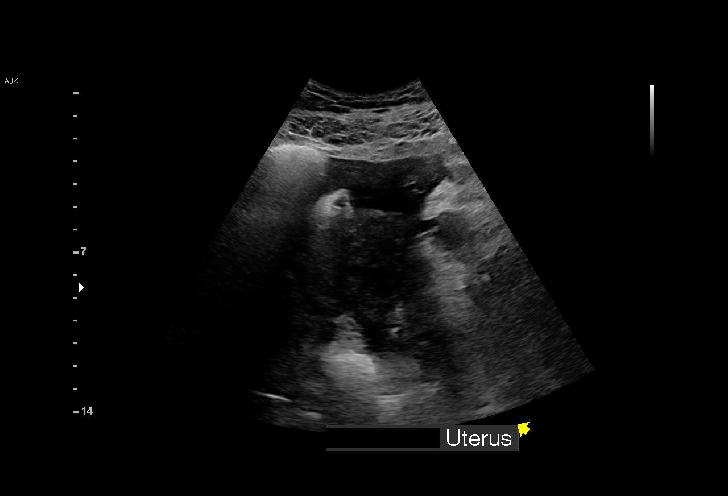
[im 11/72]
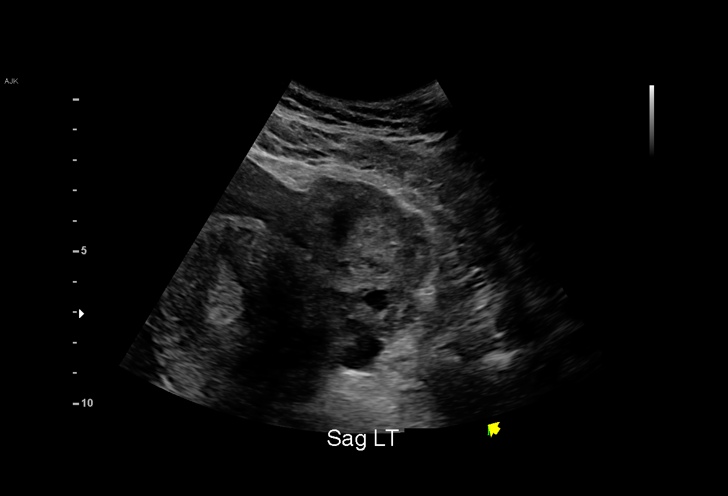
[im 16/72]
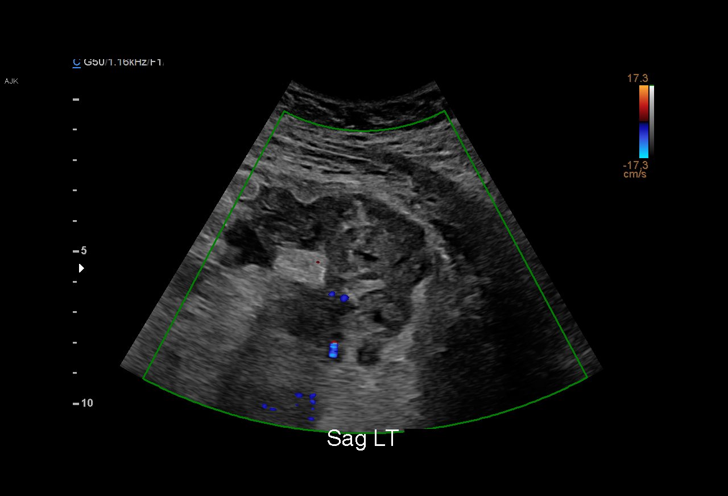
[im 22/72]
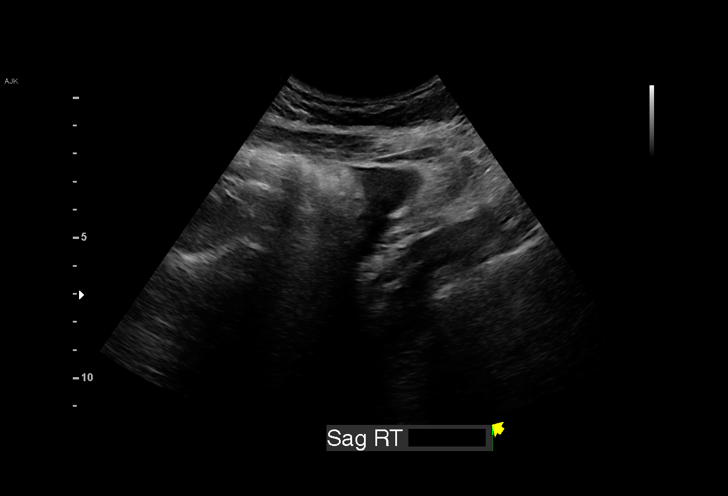
[im 27/72]
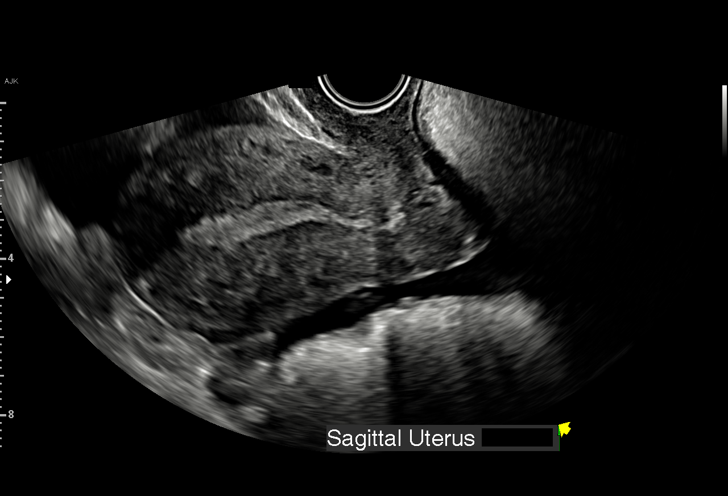
[im 32/72]
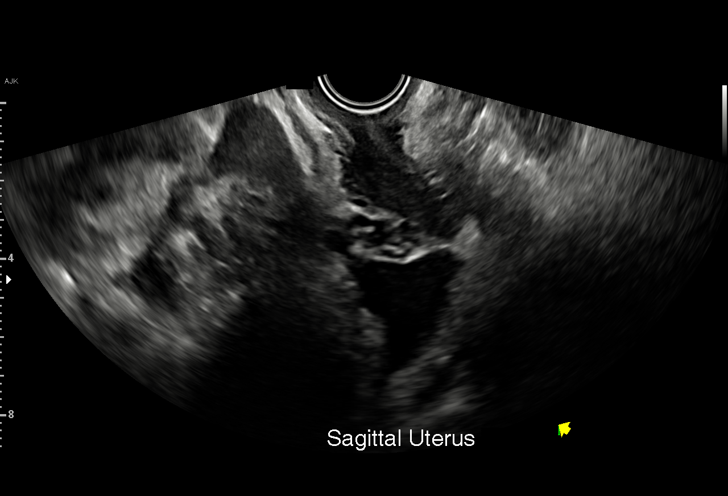
[im 37/72]
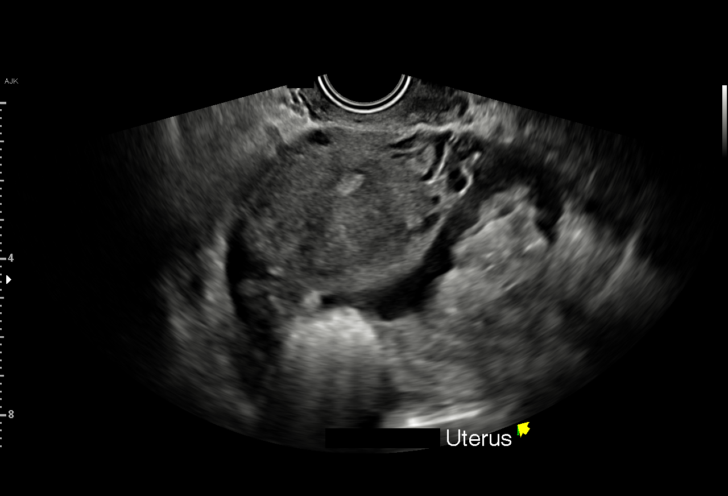
[im 40/72]
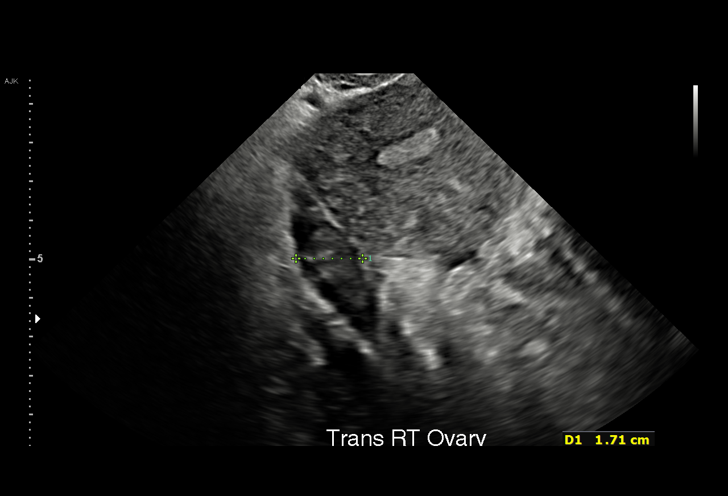
[im 45/72]
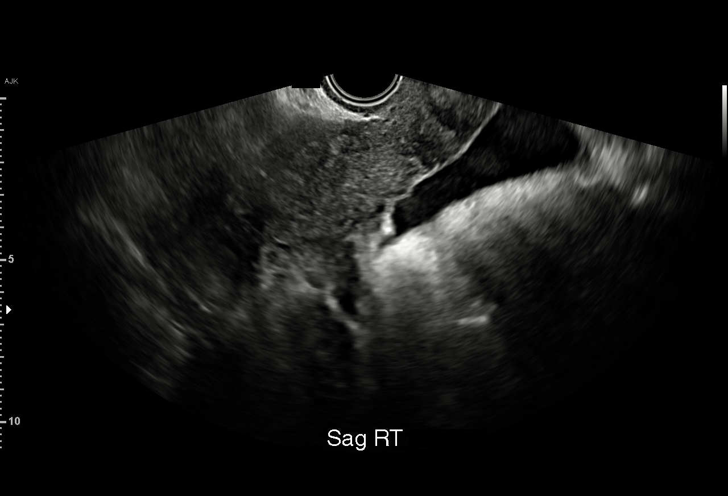
[im 50/72]
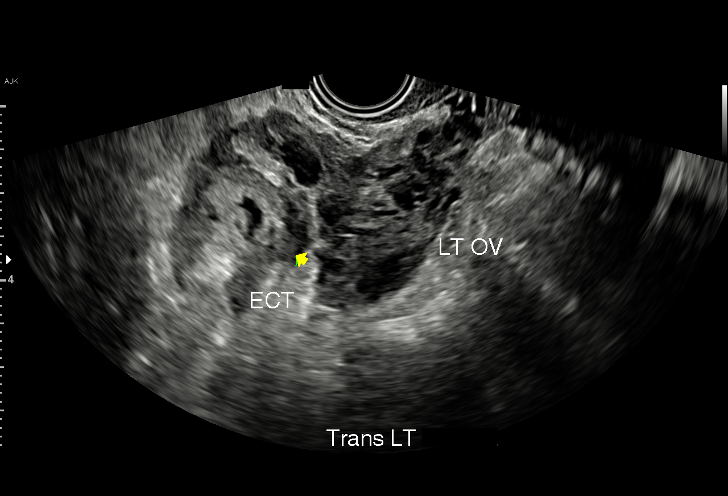
[im 56/72]
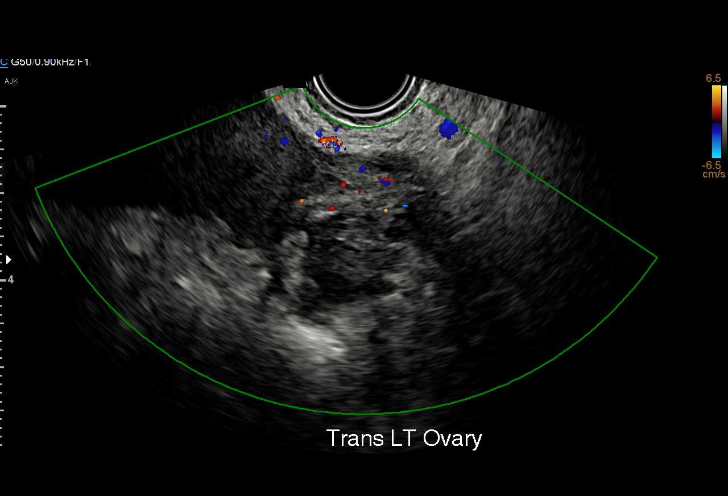
[im 61/72]
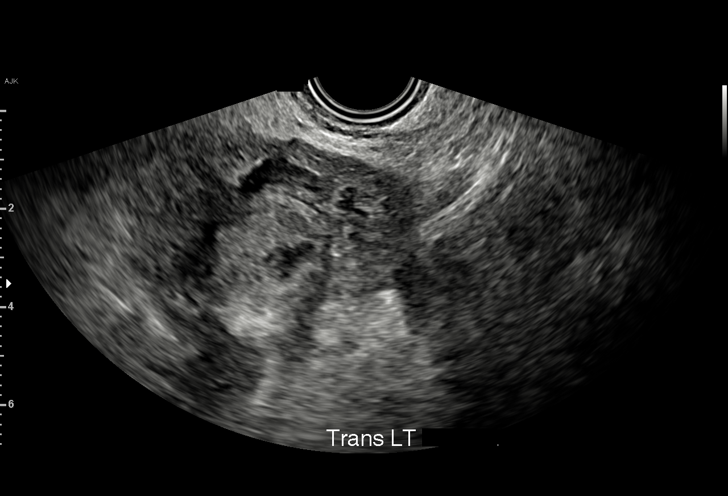
[im 66/72]
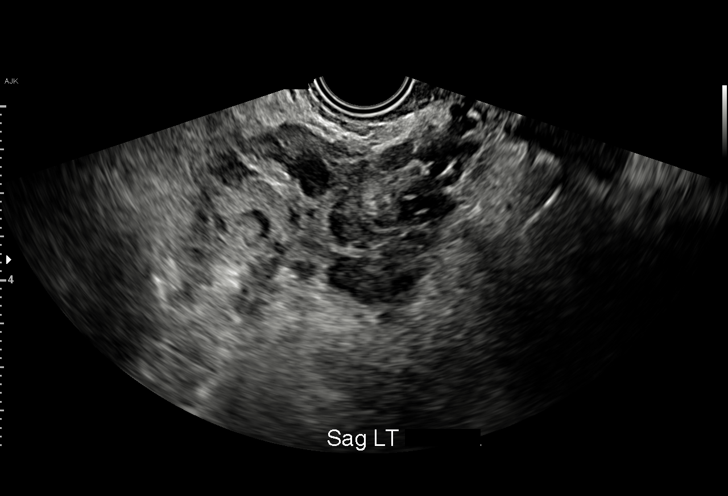
[im 72/72]
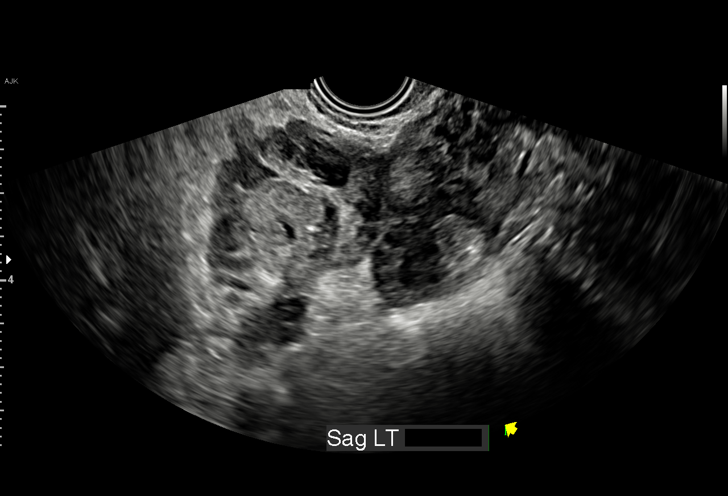

[15 of 28 positions shown; findings below may reference images not displayed]

FINDINGS: The uterus is anteverted and appears unremarkable.

The endometrium measures 7 mm in thickness. No intrauterine
pregnancy identified.

The right ovary is unremarkable. There is a corpus luteum in the
left ovary.

There is a 3.6 x 2.9 x 2.6 cm mass in the left adnexa. There is
heterogeneous echogenic content adjacent to this mass as well as
moderate amount of complex fluid within the pelvis consistent with
blood product.
IMPRESSION: Known left adnexal ectopic pregnancy with evidence of rupture and
moderate amount of blood product within the pelvis.

These results were called by telephone at the time of interpretation
on 01/25/2022 at [DATE] to nurse [REDACTED], [place], who verbally
acknowledged these results.

## 2023-07-07 ENCOUNTER — Other Ambulatory Visit: Payer: Self-pay | Admitting: Physician Assistant

## 2023-07-07 DIAGNOSIS — E559 Vitamin D deficiency, unspecified: Secondary | ICD-10-CM

## 2023-07-07 DIAGNOSIS — R5383 Other fatigue: Secondary | ICD-10-CM

## 2023-07-10 ENCOUNTER — Other Ambulatory Visit: Payer: Self-pay | Admitting: Physician Assistant

## 2023-07-10 DIAGNOSIS — R5383 Other fatigue: Secondary | ICD-10-CM

## 2023-07-10 DIAGNOSIS — E559 Vitamin D deficiency, unspecified: Secondary | ICD-10-CM

## 2023-07-14 ENCOUNTER — Other Ambulatory Visit (HOSPITAL_BASED_OUTPATIENT_CLINIC_OR_DEPARTMENT_OTHER): Payer: Self-pay

## 2023-07-14 MED ORDER — LISDEXAMFETAMINE DIMESYLATE 40 MG PO CAPS
40.0000 mg | ORAL_CAPSULE | Freq: Every day | ORAL | 0 refills | Status: DC
  Filled 2023-07-14: qty 30, 30d supply, fill #0

## 2023-08-06 ENCOUNTER — Other Ambulatory Visit (HOSPITAL_BASED_OUTPATIENT_CLINIC_OR_DEPARTMENT_OTHER): Payer: Self-pay

## 2023-08-06 MED ORDER — LISDEXAMFETAMINE DIMESYLATE 50 MG PO CAPS
50.0000 mg | ORAL_CAPSULE | Freq: Every day | ORAL | 0 refills | Status: DC
  Filled 2023-08-06 – 2023-08-11 (×2): qty 30, 30d supply, fill #0

## 2023-08-11 ENCOUNTER — Other Ambulatory Visit: Payer: Self-pay

## 2023-08-11 ENCOUNTER — Other Ambulatory Visit (HOSPITAL_BASED_OUTPATIENT_CLINIC_OR_DEPARTMENT_OTHER): Payer: Self-pay

## 2023-08-19 DIAGNOSIS — N943 Premenstrual tension syndrome: Secondary | ICD-10-CM | POA: Diagnosis not present

## 2023-08-19 DIAGNOSIS — E280 Estrogen excess: Secondary | ICD-10-CM | POA: Diagnosis not present

## 2023-09-02 ENCOUNTER — Other Ambulatory Visit (HOSPITAL_BASED_OUTPATIENT_CLINIC_OR_DEPARTMENT_OTHER): Payer: Self-pay

## 2023-09-02 DIAGNOSIS — Z79899 Other long term (current) drug therapy: Secondary | ICD-10-CM | POA: Diagnosis not present

## 2023-09-02 DIAGNOSIS — F419 Anxiety disorder, unspecified: Secondary | ICD-10-CM | POA: Diagnosis not present

## 2023-09-02 DIAGNOSIS — F902 Attention-deficit hyperactivity disorder, combined type: Secondary | ICD-10-CM | POA: Diagnosis not present

## 2023-09-02 MED ORDER — LISDEXAMFETAMINE DIMESYLATE 50 MG PO CAPS
50.0000 mg | ORAL_CAPSULE | Freq: Every day | ORAL | 0 refills | Status: DC
  Filled 2023-09-02 – 2023-09-11 (×2): qty 30, 30d supply, fill #0

## 2023-09-11 ENCOUNTER — Other Ambulatory Visit (HOSPITAL_BASED_OUTPATIENT_CLINIC_OR_DEPARTMENT_OTHER): Payer: Self-pay

## 2023-10-10 ENCOUNTER — Other Ambulatory Visit (HOSPITAL_BASED_OUTPATIENT_CLINIC_OR_DEPARTMENT_OTHER): Payer: Self-pay

## 2023-10-10 MED ORDER — LISDEXAMFETAMINE DIMESYLATE 10 MG PO CAPS
10.0000 mg | ORAL_CAPSULE | Freq: Every day | ORAL | 0 refills | Status: DC
  Filled 2023-10-10: qty 30, 30d supply, fill #0

## 2023-10-10 MED ORDER — LISDEXAMFETAMINE DIMESYLATE 40 MG PO CAPS
40.0000 mg | ORAL_CAPSULE | Freq: Every morning | ORAL | 0 refills | Status: DC
  Filled 2023-10-10: qty 30, 30d supply, fill #0

## 2023-10-21 ENCOUNTER — Other Ambulatory Visit: Payer: Self-pay | Admitting: Medical Genetics

## 2023-10-21 ENCOUNTER — Ambulatory Visit: Payer: BC Managed Care – PPO | Admitting: Physician Assistant

## 2023-10-31 ENCOUNTER — Other Ambulatory Visit: Payer: Self-pay | Admitting: Physician Assistant

## 2023-10-31 DIAGNOSIS — F3281 Premenstrual dysphoric disorder: Secondary | ICD-10-CM

## 2023-11-01 DIAGNOSIS — R3989 Other symptoms and signs involving the genitourinary system: Secondary | ICD-10-CM | POA: Diagnosis not present

## 2023-11-11 ENCOUNTER — Other Ambulatory Visit (HOSPITAL_BASED_OUTPATIENT_CLINIC_OR_DEPARTMENT_OTHER): Payer: Self-pay

## 2023-11-11 MED ORDER — LISDEXAMFETAMINE DIMESYLATE 50 MG PO CAPS
50.0000 mg | ORAL_CAPSULE | Freq: Every day | ORAL | 0 refills | Status: DC
Start: 2023-11-11 — End: 2023-12-11
  Filled 2023-11-11: qty 30, 30d supply, fill #0

## 2023-12-11 ENCOUNTER — Other Ambulatory Visit (HOSPITAL_BASED_OUTPATIENT_CLINIC_OR_DEPARTMENT_OTHER): Payer: Self-pay

## 2023-12-11 DIAGNOSIS — F902 Attention-deficit hyperactivity disorder, combined type: Secondary | ICD-10-CM | POA: Diagnosis not present

## 2023-12-11 DIAGNOSIS — Z79899 Other long term (current) drug therapy: Secondary | ICD-10-CM | POA: Diagnosis not present

## 2023-12-11 MED ORDER — LISDEXAMFETAMINE DIMESYLATE 50 MG PO CAPS: 50.0000 mg | ORAL_CAPSULE | Freq: Every day | ORAL | 0 refills | Status: DC

## 2023-12-25 ENCOUNTER — Ambulatory Visit: Payer: BC Managed Care – PPO | Admitting: Physician Assistant

## 2024-01-02 DIAGNOSIS — N644 Mastodynia: Secondary | ICD-10-CM | POA: Insufficient documentation

## 2024-01-05 ENCOUNTER — Other Ambulatory Visit (HOSPITAL_BASED_OUTPATIENT_CLINIC_OR_DEPARTMENT_OTHER): Payer: Self-pay

## 2024-01-05 DIAGNOSIS — Z8759 Personal history of other complications of pregnancy, childbirth and the puerperium: Secondary | ICD-10-CM | POA: Diagnosis not present

## 2024-01-05 DIAGNOSIS — F3281 Premenstrual dysphoric disorder: Secondary | ICD-10-CM | POA: Diagnosis not present

## 2024-01-05 DIAGNOSIS — N644 Mastodynia: Secondary | ICD-10-CM | POA: Diagnosis not present

## 2024-01-05 MED ORDER — SERTRALINE HCL 25 MG PO TABS
25.0000 mg | ORAL_TABLET | Freq: Every day | ORAL | 1 refills | Status: DC
  Filled 2024-01-05: qty 30, 30d supply, fill #0

## 2024-01-08 ENCOUNTER — Other Ambulatory Visit (HOSPITAL_BASED_OUTPATIENT_CLINIC_OR_DEPARTMENT_OTHER): Payer: Self-pay

## 2024-01-14 ENCOUNTER — Other Ambulatory Visit (HOSPITAL_BASED_OUTPATIENT_CLINIC_OR_DEPARTMENT_OTHER): Payer: Self-pay

## 2024-01-14 ENCOUNTER — Other Ambulatory Visit: Payer: Self-pay

## 2024-01-14 MED ORDER — LISDEXAMFETAMINE DIMESYLATE 50 MG PO CAPS
50.0000 mg | ORAL_CAPSULE | Freq: Every day | ORAL | 0 refills | Status: DC
  Filled 2024-01-14: qty 30, 30d supply, fill #0

## 2024-02-13 ENCOUNTER — Other Ambulatory Visit (HOSPITAL_BASED_OUTPATIENT_CLINIC_OR_DEPARTMENT_OTHER): Payer: Self-pay

## 2024-02-13 ENCOUNTER — Encounter: Payer: BC Managed Care – PPO | Admitting: Physician Assistant

## 2024-02-13 MED ORDER — LISDEXAMFETAMINE DIMESYLATE 50 MG PO CAPS
50.0000 mg | ORAL_CAPSULE | Freq: Every day | ORAL | 0 refills | Status: DC
  Filled 2024-02-13: qty 30, 30d supply, fill #0

## 2024-02-14 ENCOUNTER — Other Ambulatory Visit (HOSPITAL_BASED_OUTPATIENT_CLINIC_OR_DEPARTMENT_OTHER): Payer: Self-pay

## 2024-03-03 ENCOUNTER — Other Ambulatory Visit

## 2024-03-10 DIAGNOSIS — F4310 Post-traumatic stress disorder, unspecified: Secondary | ICD-10-CM | POA: Diagnosis not present

## 2024-03-10 DIAGNOSIS — F411 Generalized anxiety disorder: Secondary | ICD-10-CM | POA: Diagnosis not present

## 2024-03-10 DIAGNOSIS — F331 Major depressive disorder, recurrent, moderate: Secondary | ICD-10-CM | POA: Diagnosis not present

## 2024-03-12 ENCOUNTER — Telehealth (INDEPENDENT_AMBULATORY_CARE_PROVIDER_SITE_OTHER): Admitting: Adult Health

## 2024-03-15 ENCOUNTER — Other Ambulatory Visit (HOSPITAL_BASED_OUTPATIENT_CLINIC_OR_DEPARTMENT_OTHER): Payer: Self-pay

## 2024-03-15 MED ORDER — LISDEXAMFETAMINE DIMESYLATE 50 MG PO CAPS
50.0000 mg | ORAL_CAPSULE | Freq: Every day | ORAL | 0 refills | Status: DC
  Filled 2024-03-15: qty 30, 30d supply, fill #0

## 2024-03-17 DIAGNOSIS — F4310 Post-traumatic stress disorder, unspecified: Secondary | ICD-10-CM | POA: Diagnosis not present

## 2024-03-17 DIAGNOSIS — F411 Generalized anxiety disorder: Secondary | ICD-10-CM | POA: Diagnosis not present

## 2024-03-17 DIAGNOSIS — F331 Major depressive disorder, recurrent, moderate: Secondary | ICD-10-CM | POA: Diagnosis not present

## 2024-03-18 ENCOUNTER — Telehealth: Payer: Self-pay | Admitting: Physician Assistant

## 2024-03-18 NOTE — Telephone Encounter (Signed)
 LVM informing pt that if wanting labs done same day then prefer to come in office for visit. Also informed that if just wanting to discuss possibility of getting labs then can stay virtual.

## 2024-03-18 NOTE — Telephone Encounter (Signed)
 Please see message and let me know how to proceed

## 2024-03-18 NOTE — Telephone Encounter (Signed)
 Please see provider response and schedule patient appropriately

## 2024-03-18 NOTE — Telephone Encounter (Signed)
 Pt scheduled herself a VV w/pcp for 5/16. Appointment notes state "PMDD follow up. Off Sertraline . Get bloodwork scheduled." .  Pt last seen in office on 04/17/23. Please advise if needs to be in office or if can stay virtual.

## 2024-03-19 ENCOUNTER — Encounter: Admitting: Physician Assistant

## 2024-03-21 NOTE — Progress Notes (Signed)
 DNKA

## 2024-03-23 DIAGNOSIS — F4310 Post-traumatic stress disorder, unspecified: Secondary | ICD-10-CM | POA: Diagnosis not present

## 2024-03-23 DIAGNOSIS — F411 Generalized anxiety disorder: Secondary | ICD-10-CM | POA: Diagnosis not present

## 2024-03-23 DIAGNOSIS — F331 Major depressive disorder, recurrent, moderate: Secondary | ICD-10-CM | POA: Diagnosis not present

## 2024-03-30 DIAGNOSIS — F411 Generalized anxiety disorder: Secondary | ICD-10-CM | POA: Diagnosis not present

## 2024-03-30 DIAGNOSIS — F331 Major depressive disorder, recurrent, moderate: Secondary | ICD-10-CM | POA: Diagnosis not present

## 2024-03-30 DIAGNOSIS — F4310 Post-traumatic stress disorder, unspecified: Secondary | ICD-10-CM | POA: Diagnosis not present

## 2024-04-07 DIAGNOSIS — F331 Major depressive disorder, recurrent, moderate: Secondary | ICD-10-CM | POA: Diagnosis not present

## 2024-04-07 DIAGNOSIS — F4310 Post-traumatic stress disorder, unspecified: Secondary | ICD-10-CM | POA: Diagnosis not present

## 2024-04-07 DIAGNOSIS — F411 Generalized anxiety disorder: Secondary | ICD-10-CM | POA: Diagnosis not present

## 2024-04-14 DIAGNOSIS — F4310 Post-traumatic stress disorder, unspecified: Secondary | ICD-10-CM | POA: Diagnosis not present

## 2024-04-14 DIAGNOSIS — F411 Generalized anxiety disorder: Secondary | ICD-10-CM | POA: Diagnosis not present

## 2024-04-14 DIAGNOSIS — F331 Major depressive disorder, recurrent, moderate: Secondary | ICD-10-CM | POA: Diagnosis not present

## 2024-04-19 ENCOUNTER — Other Ambulatory Visit (HOSPITAL_BASED_OUTPATIENT_CLINIC_OR_DEPARTMENT_OTHER): Payer: Self-pay

## 2024-04-19 MED ORDER — LISDEXAMFETAMINE DIMESYLATE 50 MG PO CAPS
50.0000 mg | ORAL_CAPSULE | Freq: Every day | ORAL | 0 refills | Status: DC
Start: 2024-04-19 — End: 2024-05-27
  Filled 2024-04-19: qty 30, 30d supply, fill #0

## 2024-04-28 DIAGNOSIS — F4310 Post-traumatic stress disorder, unspecified: Secondary | ICD-10-CM | POA: Diagnosis not present

## 2024-04-28 DIAGNOSIS — F411 Generalized anxiety disorder: Secondary | ICD-10-CM | POA: Diagnosis not present

## 2024-04-28 DIAGNOSIS — F331 Major depressive disorder, recurrent, moderate: Secondary | ICD-10-CM | POA: Diagnosis not present

## 2024-05-05 DIAGNOSIS — F411 Generalized anxiety disorder: Secondary | ICD-10-CM | POA: Diagnosis not present

## 2024-05-20 DIAGNOSIS — F331 Major depressive disorder, recurrent, moderate: Secondary | ICD-10-CM | POA: Diagnosis not present

## 2024-05-20 DIAGNOSIS — F4310 Post-traumatic stress disorder, unspecified: Secondary | ICD-10-CM | POA: Diagnosis not present

## 2024-05-20 DIAGNOSIS — F411 Generalized anxiety disorder: Secondary | ICD-10-CM | POA: Diagnosis not present

## 2024-05-25 DIAGNOSIS — F411 Generalized anxiety disorder: Secondary | ICD-10-CM | POA: Diagnosis not present

## 2024-05-25 DIAGNOSIS — F331 Major depressive disorder, recurrent, moderate: Secondary | ICD-10-CM | POA: Diagnosis not present

## 2024-05-25 DIAGNOSIS — F4310 Post-traumatic stress disorder, unspecified: Secondary | ICD-10-CM | POA: Diagnosis not present

## 2024-05-27 ENCOUNTER — Other Ambulatory Visit (HOSPITAL_BASED_OUTPATIENT_CLINIC_OR_DEPARTMENT_OTHER): Payer: Self-pay

## 2024-05-27 DIAGNOSIS — F902 Attention-deficit hyperactivity disorder, combined type: Secondary | ICD-10-CM | POA: Diagnosis not present

## 2024-05-27 DIAGNOSIS — F419 Anxiety disorder, unspecified: Secondary | ICD-10-CM | POA: Diagnosis not present

## 2024-05-27 DIAGNOSIS — F338 Other recurrent depressive disorders: Secondary | ICD-10-CM | POA: Diagnosis not present

## 2024-05-27 DIAGNOSIS — Z79899 Other long term (current) drug therapy: Secondary | ICD-10-CM | POA: Diagnosis not present

## 2024-05-27 MED ORDER — PROPRANOLOL HCL 10 MG PO TABS
10.0000 mg | ORAL_TABLET | Freq: Every day | ORAL | 6 refills | Status: DC | PRN
  Filled 2024-05-27: qty 30, 30d supply, fill #0

## 2024-05-27 MED ORDER — LISDEXAMFETAMINE DIMESYLATE 50 MG PO CAPS
50.0000 mg | ORAL_CAPSULE | Freq: Every day | ORAL | 0 refills | Status: DC
  Filled 2024-05-27: qty 30, 30d supply, fill #0

## 2024-05-31 ENCOUNTER — Other Ambulatory Visit (HOSPITAL_BASED_OUTPATIENT_CLINIC_OR_DEPARTMENT_OTHER): Payer: Self-pay

## 2024-05-31 MED ORDER — SERTRALINE HCL 50 MG PO TABS
50.0000 mg | ORAL_TABLET | Freq: Every day | ORAL | 2 refills | Status: DC
  Filled 2024-05-31: qty 90, 90d supply, fill #0

## 2024-06-01 ENCOUNTER — Ambulatory Visit: Payer: Self-pay

## 2024-06-01 NOTE — Telephone Encounter (Signed)
 Appt tomorrow.

## 2024-06-01 NOTE — Telephone Encounter (Signed)
 FYI Only or Action Required?: FYI only for provider.  Patient was last seen in primary care on 04/17/2023.  Called Nurse Triage reporting Tachycardia.  Symptoms began several months ago.  Interventions attempted: Other: drinking electrolyte drinks.  Symptoms are: intermittent tachycardia (current HR 97, states it was 139 earlier today), anxiety, hot flashes, headaches, occasional diarrhea unchanged.  Triage Disposition: See Physician within 24 hours  Patient/caregiver understands and will follow disposition?:  Yes               Reason for Disposition  Heart rhythm alert (e.g., you have irregular heartbeat) from personal wearable device (e.g., Apple Watch)  Answer Assessment - Initial Assessment Questions Patient states her psychiatrist told her that her heart rate has been elevated during her visits and recommends she see her PCP about it. Patient states she is going through stress while being separated from her partner. She states her Apple watch has notified twice that her heart rate has been elevated and once was in her sleep. Patient added she is taking Vyvanse  and is wondering if that is attributing to her symptoms but states she has not been taking the medication throughout the entire year.   1. DESCRIPTION: Please describe your heart rate or heartbeat that you are having (e.g., fast/slow, regular/irregular, skipped or extra beats, palpitations)     Fast heart rate. She states: I feel like I can feel it. I feel stressed.  2. ONSET: When did it start? (e.g., minutes, hours, days)      She states her Apple watch has recorded heart rates elevated to 140s as far back as February.  3. DURATION: How long does it last (e.g., seconds, minutes, hours)     She states she is unsure, she states right now she feels like her heart is fast but it is only in the 90s.  4. PATTERN Does it come and go, or has it been constant since it started?  Does it get worse with  exertion?   Are you feeling it now?     Yes she is feeling it now. She states she did not even notice the feeling until her psychiatrist told her.  5. TAP: Using your hand, can you tap out what you are feeling on a chair or table in front of you, so that I can hear? Note: Not all patients can do this.       N/A.  6. HEART RATE: Can you tell me your heart rate? How many beats in 15 seconds?  Note: Not all patients can do this.       Currently her heart rate is 97, she states earlier today it got up to 139 (she denies any physical activity today).  7. RECURRENT SYMPTOM: Have you ever had this before? If Yes, ask: When was the last time? and What happened that time?      Yes, she states sometime last year she spoke with her PCP about it and they discussed it may be POTS. She states she did the tilt test which was negative.  8. CAUSE: What do you think is causing the palpitations?     Stress, anxiety.  9. CARDIAC HISTORY: Do you have any history of heart disease? (e.g., heart attack, angina, bypass surgery, angioplasty, arrhythmia)      No.  10. OTHER SYMPTOMS: Do you have any other symptoms? (e.g., dizziness, chest pain, sweating, difficulty breathing)       Anxiety, hot flashes, headaches, occasional diarrhea. Patient denies chest pain,  SOB, dizziness, sweating, nausea, vomiting.  11. PREGNANCY: Is there any chance you are pregnant? When was your last menstrual period?       LMP: 05/21/24.  Protocols used: Heart Rate and Heartbeat Questions-A-AH

## 2024-06-02 ENCOUNTER — Ambulatory Visit: Payer: Self-pay | Admitting: Family Medicine

## 2024-06-02 ENCOUNTER — Encounter: Payer: Self-pay | Admitting: Family Medicine

## 2024-06-02 ENCOUNTER — Telehealth: Payer: Self-pay | Admitting: *Deleted

## 2024-06-02 ENCOUNTER — Ambulatory Visit (INDEPENDENT_AMBULATORY_CARE_PROVIDER_SITE_OTHER): Admitting: Family Medicine

## 2024-06-02 ENCOUNTER — Other Ambulatory Visit (HOSPITAL_BASED_OUTPATIENT_CLINIC_OR_DEPARTMENT_OTHER): Payer: Self-pay

## 2024-06-02 ENCOUNTER — Ambulatory Visit: Attending: Family Medicine

## 2024-06-02 VITALS — BP 132/89 | HR 120 | Temp 98.1°F | Resp 18 | Ht 68.0 in | Wt 199.5 lb

## 2024-06-02 DIAGNOSIS — E559 Vitamin D deficiency, unspecified: Secondary | ICD-10-CM | POA: Diagnosis not present

## 2024-06-02 DIAGNOSIS — N61 Mastitis without abscess: Secondary | ICD-10-CM | POA: Diagnosis not present

## 2024-06-02 DIAGNOSIS — N643 Galactorrhea not associated with childbirth: Secondary | ICD-10-CM

## 2024-06-02 DIAGNOSIS — R000 Tachycardia, unspecified: Secondary | ICD-10-CM

## 2024-06-02 DIAGNOSIS — O926 Galactorrhea: Secondary | ICD-10-CM | POA: Diagnosis not present

## 2024-06-02 LAB — COMPREHENSIVE METABOLIC PANEL WITH GFR
ALT: 21 U/L (ref 0–35)
AST: 15 U/L (ref 0–37)
Albumin: 4.3 g/dL (ref 3.5–5.2)
Alkaline Phosphatase: 50 U/L (ref 39–117)
BUN: 21 mg/dL (ref 6–23)
CO2: 25 meq/L (ref 19–32)
Calcium: 9.5 mg/dL (ref 8.4–10.5)
Chloride: 108 meq/L (ref 96–112)
Creatinine, Ser: 0.78 mg/dL (ref 0.40–1.20)
GFR: 96.54 mL/min (ref >60.00)
Glucose, Bld: 86 mg/dL (ref 70–99)
Potassium: 4.2 meq/L (ref 3.5–5.1)
Sodium: 140 meq/L (ref 135–145)
Total Bilirubin: 0.3 mg/dL (ref 0.2–1.2)
Total Protein: 7.2 g/dL (ref 6.0–8.3)

## 2024-06-02 LAB — CBC WITH DIFFERENTIAL/PLATELET
Basophils Absolute: 0.1 K/uL (ref 0.0–0.1)
Basophils Relative: 0.5 % (ref 0.0–3.0)
Eosinophils Absolute: 0.4 K/uL (ref 0.0–0.7)
Eosinophils Relative: 3 % (ref 0.0–5.0)
HCT: 43.2 % (ref 36.0–46.0)
Hemoglobin: 14 g/dL (ref 12.0–15.0)
Lymphocytes Relative: 14.1 % (ref 12.0–46.0)
Lymphs Abs: 1.7 K/uL (ref 0.7–4.0)
MCHC: 32.5 g/dL (ref 30.0–36.0)
MCV: 88.7 fl (ref 78.0–100.0)
Monocytes Absolute: 0.8 K/uL (ref 0.1–1.0)
Monocytes Relative: 6.3 % (ref 3.0–12.0)
Neutro Abs: 9.4 K/uL — ABNORMAL HIGH (ref 1.4–7.7)
Neutrophils Relative %: 76.1 % (ref 43.0–77.0)
Platelets: 395 K/uL (ref 150.0–400.0)
RBC: 4.87 Mil/uL (ref 3.87–5.11)
RDW: 13.5 % (ref 11.5–15.5)
WBC: 12.3 K/uL — ABNORMAL HIGH (ref 4.0–10.5)

## 2024-06-02 LAB — VITAMIN D 25 HYDROXY (VIT D DEFICIENCY, FRACTURES): VITD: 50.81 ng/mL (ref 30.00–100.00)

## 2024-06-02 LAB — PROLACTIN: Prolactin: 5.7 ng/mL

## 2024-06-02 LAB — TSH: TSH: 1.74 u[IU]/mL (ref 0.35–5.50)

## 2024-06-02 MED ORDER — CLINDAMYCIN HCL 300 MG PO CAPS
300.0000 mg | ORAL_CAPSULE | Freq: Four times a day (QID) | ORAL | 0 refills | Status: AC
  Filled 2024-06-02: qty 40, 10d supply, fill #0

## 2024-06-02 MED ORDER — PROPRANOLOL HCL 20 MG PO TABS
20.0000 mg | ORAL_TABLET | Freq: Two times a day (BID) | ORAL | 0 refills | Status: DC
  Filled 2024-06-02: qty 60, 30d supply, fill #0

## 2024-06-02 NOTE — Progress Notes (Signed)
 Subjective:     Patient ID: Kelli Perez, female    DOB: [birth year], 38 y.o.   MRN: [id number]  Chief Complaint  Patient presents with   Elevated Heart Rate    Hr has been elevated for the past few months   Clogged duct    Milk will not go away    HPI Discussed the use of AI scribe software for clinical note transcription with the patient, who gave verbal consent to proceed.  History of Present Illness Kelli Perez is a 38 year old female who presents with persistent tachycardia and breast issues. She is accompanied by her children.  She has experienced an increased heart rate for over a year, initially noticed during visits to her psychiatrist. Her heart rate frequently reaches 137-140 bpm during minimal activity, such as walking around the house. She is currently on Vyvanse , but the heart rate issue predates this medication. A beta blocker was recently prescribed and increased in dosage but did not alleviate the symptoms. She experiences heart racing, particularly during stressful times, and has a history of dizziness and fainting(years ago), which has improved with the intake of electrolytes. No chest pain, but she feels anxious and occasionally lightheaded.  She has a history of PMDD, which exacerbates her symptoms of anxiety and heart racing, particularly around ovulation and the week before her period. She experiences heavy periods and has not been taking sertraline , which was recently prescribed to manage her anxiety.  She reports intermittent milk production from her right breast, which began over a year ago after weaning her child. The issue is triggered by her child touching her chest(laying his head on chest), leading to clogged ducts and soreness. Recently, the right breast has been red and sore for about six days, but she has not experienced fever. She has a history of allergies to penicillin and cephalosporins, though she is unsure of the specific reactions. No galactorrhea  regulaly  She has previously tried propranolol , which was increased in dosage, but did not notice significant improvement. She is concerned about taking multiple medications due to sensitivity to changes in medication and hormones.  H/o vit D deficiency    Health Maintenance Due  Topic Date Due   Hepatitis C Screening  Never done   Hepatitis B Vaccines (1 of 3 - 19+ 3-dose series) Never done    Past Medical History:  Diagnosis Date   Anemia    Anxiety    Asthma    Complication of anesthesia    Ectopic pregnancy without intrauterine pregnancy 01/26/2022   GERD (gastroesophageal reflux disease)    Medical history non-contributory    PONV (postoperative nausea and vomiting)    Seizures (HCC)    SVD (spontaneous vaginal delivery) 05/22/2018    Past Surgical History:  Procedure Laterality Date   DIAGNOSTIC LAPAROSCOPY WITH REMOVAL OF ECTOPIC PREGNANCY N/A 01/25/2022   Procedure: DIAGNOSTIC LAPAROSCOPY WITH REMOVAL OF ECTOPIC PREGNANCY;  Surgeon: Barbette Knock, MD;  Location: [facility] OR;  Service: Gynecology;  Laterality: N/A;   SKIN SURGERY     birthmark removal on back     Current Outpatient Medications:    clindamycin  (CLEOCIN ) 300 MG capsule, Take 1 capsule (300 mg total) by mouth 4 (four) times daily for 10 days., Disp: 40 capsule, Rfl: 0   ibuprofen  (ADVIL ) 600 MG tablet, TK 1 T PO Q 6 H, Disp: , Rfl:    lisdexamfetamine (VYVANSE ) 50 MG capsule, Take 1 capsule (50 mg total) by mouth daily., Disp: 30 capsule,  Rfl: 0   Magnesium Glycinate 100 MG CAPS, Take 120 mg by mouth., Disp: , Rfl:    propranolol  (INDERAL ) 20 MG tablet, Take 1 tablet (20 mg total) by mouth 2 (two) times daily., Disp: 60 tablet, Rfl: 0   sertraline  (ZOLOFT ) 50 MG tablet, Take 1 tablet (50 mg total) by mouth daily., Disp: 90 tablet, Rfl: 2   Vitamin D -Vitamin K (VITAMIN K2-VITAMIN D3 PO), , Disp: , Rfl:   Allergies  Allergen Reactions   Cefprozil Other (See Comments)    Uknown  Cefzil   Cephalosporins  Other (See Comments)   Penicillins Other (See Comments)    Unknown  Product containing penicillin (product)   Sulfa Antibiotics Other (See Comments)    Uknown reaction.  Substance with sulfonamide structure and antibacterial mechanism of action (substance)   Sulfamethoxazole Other (See Comments)   ROS neg/noncontributory except as noted HPI/below      Objective:     BP 132/89   Pulse (!) 120   Temp 98.1 F (36.7 C) (Temporal)   Resp 18   Ht 5' 8 (1.727 m)   Wt 199 lb 8 oz (90.5 kg)   SpO2 99%   BMI 30.33 kg/m  Wt Readings from Last 3 Encounters:  06/02/24 199 lb 8 oz (90.5 kg)  04/17/23 215 lb 3.2 oz (97.6 kg)  01/15/23 208 lb 3.2 oz (94.4 kg)    Physical Exam Chest:        Gen: WDWN NAD HEENT: NCAT, conjunctiva not injected, sclera nonicteric NECK:  supple, no thyromegaly, no nodes, no carotid bruits CARDIAC: tachy RRR, S1S2+, no murmur. DP 2+B LUNGS: CTAB. No wheezes ABDOMEN:  BS+, soft, NTND, No HSM, no masses EXT:  no edema MSK: no gross abnormalities.  NEURO: A&O x3.  CN II-XII intact.  PSYCH: normal mood. Good eye contact R breast-area above.  Red, warm tender, several cm mobile mass  EKG:  sinus tach. No st changes.     Assessment & Plan:  Tachycardia -     CBC with Differential/Platelet -     Comprehensive metabolic panel with GFR -     TSH -     EKG 12-Lead -     LONG TERM MONITOR (3-14 DAYS); Future  Mastitis, right, acute  Galactorrhea -     Prolactin  Vitamin D  deficiency -     VITAMIN D  25 Hydroxy (Vit-D Deficiency, Fractures)  Other orders -     Clindamycin  HCl; Take 1 capsule (300 mg total) by mouth 4 (four) times daily for 10 days.  Dispense: 40 capsule; Refill: 0 -     Propranolol  HCl; Take 1 tablet (20 mg total) by mouth 2 (two) times daily.  Dispense: 60 tablet; Refill: 0  Assessment and Plan Assessment & Plan Tachycardia with history of syncope and possible postural orthostatic tachycardia syndrome (POTS)   Chronic  tachycardia with heart rates reaching 137-140 bpm during minimal activity is noted, accompanied by heart racing, occasional dizziness, and syncope in past. Symptoms suggest possible possible POTS, with anxiety  as contributing factors. The current low-dose propranolol  is ineffective. Anemia and thyroid  dysfunction need to be ruled out. Order an EKG to assess cardiac function and an event monitor to evaluate heart activity throughout the day. Increase propranolol  to 20 mg twice daily. Order blood work to check for anemia and thyroid  function. Consider referral to a cardiologist if lab results normal.  Right breast pain with galactorrhea and possible mastitis   She experiences right breast pain  with galactorrhea and redness for 5-6 days, possibly due to mastitis from inflammation and potential infection. Relactation was triggered by her child's touch. No fever is reported. She is allergic to penicillin and cephalosporins. Prescribe clindamycin  four times a day for infection. Follow up in two weeks for re-examination of the breast. Consider ultrasound or mammogram if a lump persists after antibiotic treatment.  Premenstrual dysphoric disorder (PMDD) with heavy menses   She suffers from PMDD with severe irritability and mood changes around ovulation and the premenstrual period, along with heavy menstrual bleeding. Anxiety and heart racing symptoms worsen during this time. She plans to start sertraline  as prescribed by her psychiatrist to manage PMDD and anxiety symptoms.  Anxiety disorder   Anxiety contributes to symptoms of heart racing and stress, potentially exacerbating tachycardia and PMDD symptoms. Start sertraline  as prescribed by her psychiatrist to manage anxiety symptoms.    Return in about 2 weeks (around 06/16/2024) for with Alyssa-f/u mastitis and tachycardia.  Jenkins CHRISTELLA Carrel, MD

## 2024-06-02 NOTE — Telephone Encounter (Signed)
 Copied from CRM [id number]. Topic: General - Other >> Jun 02, 2024  9:47 AM Revonda D wrote: Reason for CRM: Pt stated that she seen Dr.Kulik today and was informed that she would be ordering her a heart monitor. Pt would like to know where the heart monitor is being sent to for pickup. Pt would like a callback today with an update on this question.

## 2024-06-02 NOTE — Patient Instructions (Signed)
 It was very nice to see you today!  Warm soaks to breast Propranolol  twice daily   PLEASE NOTE:  If you had any lab tests please let us  know if you have not heard back within a few days. You may see your results on MyChart before we have a chance to review them but we will give you a call once they are reviewed by us . If we ordered any referrals today, please let us  know if you have not heard from their office within the next week.   Please try these tips to maintain a healthy lifestyle:  Eat most of your calories during the day when you are active. Eliminate processed foods including packaged sweets (pies, cakes, cookies), reduce intake of potatoes, white bread, white pasta, and white rice. Look for whole grain options, oat flour or almond flour.  Each meal should contain half fruits/vegetables, one quarter protein, and one quarter carbs (no bigger than a computer mouse).  Cut down on sweet beverages. This includes juice, soda, and sweet tea. Also watch fruit intake, though this is a healthier sweet option, it still contains natural sugar! Limit to 3 servings daily.  Drink at least 1 glass of water with each meal and aim for at least 8 glasses per day  Exercise at least 150 minutes every week.

## 2024-06-02 NOTE — Progress Notes (Unsigned)
 EP to read.

## 2024-06-02 NOTE — Progress Notes (Signed)
 Labs ok except wbc elevated-started on clindamycin  Since labs normal, refer to Cardiologist for tachycardia

## 2024-06-03 ENCOUNTER — Other Ambulatory Visit: Payer: Self-pay | Admitting: *Deleted

## 2024-06-03 ENCOUNTER — Telehealth

## 2024-06-03 DIAGNOSIS — R000 Tachycardia, unspecified: Secondary | ICD-10-CM

## 2024-06-03 DIAGNOSIS — N61 Mastitis without abscess: Secondary | ICD-10-CM | POA: Diagnosis not present

## 2024-06-04 ENCOUNTER — Ambulatory Visit: Payer: Self-pay | Admitting: Physician Assistant

## 2024-06-04 ENCOUNTER — Telehealth

## 2024-06-04 NOTE — Telephone Encounter (Signed)
 FYI Only or Action Required?: FYI only for provider.  Patient was last seen in primary care on 06/02/2024 by Wendolyn Jenkins Jansky, MD.  Called Nurse Triage reporting Breast Pain.  Symptoms began several days ago.  Interventions attempted: Prescription medications: clindamycin  (CLEOCIN ) 300 MG capsule .  Symptoms are: gradually improving.  Triage Disposition: Home Care  Patient/caregiver understands and will follow disposition?: Yes                     Copied from CRM #[id number]. Topic: Clinical - Red Word Triage >> Jun 04, 2024  4:36 PM Shereese L wrote: [facility] that prompted transfer to Nurse Triage: patient has a clogged boob and she's no longer breast feeding and needing help to unclog Reason for Disposition  Blocked milk ducts (tender lumps in the breast), questions about  Answer Assessment - Initial Assessment Questions Mastitis of right breast per pt, on day 3 of abx Warm to touch Redness gone Denies fever Low pain, 4/10 pain level per pt Denies nausea, chills  Protocols used: Breastfeeding - Mother's Breast Symptoms or Illness-P-AH

## 2024-06-05 ENCOUNTER — Telehealth (HOSPITAL_COMMUNITY): Payer: Self-pay | Admitting: Lactation Services

## 2024-06-05 NOTE — Telephone Encounter (Signed)
 Kelli Perez had been receiving conflicting information about the treatment of mastitis. Per Kelli Perez She was breast feeding for 5 years between her boys, then weaned about a year ago. Her breasts responded to touch from a child- creating milk flow again, resulted in mastitis. Some resources were telling mom to get milk clog out and apply heat. Mom is following up with her doctor and is on antibiotics- day 3 out of 10- starting to feel improvement of symptoms.  Discussed new-ish recommendations (2022) Ice packs Non restrictive clothing to that area Motrin  (NSAID) Tylenol  And suggested an equivocal recommendation- start a probiotic as well, help influence/increase levels of good bacteria to prevent overgrowth of bad- as antibiotics can wipe out both.  Encouraged Kelli Perez to continue antibiotic course to completion and follow back up with us  or out patient lactation if problem persists.

## 2024-06-07 ENCOUNTER — Ambulatory Visit: Payer: Self-pay

## 2024-06-07 NOTE — Telephone Encounter (Signed)
 Noted and agreed, thank you.

## 2024-06-07 NOTE — Telephone Encounter (Signed)
 FYI Only or Action Required?: FYI only for provider.  Patient was last seen in primary care on 06/02/2024 by Wendolyn Jenkins Jansky, MD.  Called Nurse Triage reporting Mass.  Symptoms began 06/03/2024.  Interventions attempted: Prescription medications: abx.  Symptoms are: gradually worsening.  Triage Disposition: See PCP When Office is Open (Within 3 Days)  Patient/caregiver understands and will follow disposition?: Yes    Copied from CRM #[id number]. Topic: Clinical - Red Word Triage >> Jun 07, 2024  4:48 PM Carlyon D wrote: Red Word that prompted transfer to Nurse Triage: pt stating she was seen for having Mastitis, pt has a lump under the right breast that is red and swollen lump is still painful. Reason for Disposition  [1] Small swelling or lump AND [2] unexplained AND [3] present > 1 week  Answer Assessment - Initial Assessment Questions 1. APPEARANCE of SWELLING: What does it look like?     Red, swollen, some pain 2. SIZE: How large is the swelling? (e.g., inches, cm; or compare to size of pinhead, tip of pen, eraser, coin, pea, grape, ping pong ball)      Golf ball size 3. LOCATION: Where is the swelling located?     Under right breast 4. ONSET: When did the swelling start? 06/03/2024 5. COLOR: What color is it? Is there more than one color?     Some reddens 6. PAIN: Is there any pain? If Yes, ask: How bad is the pain? (Scale 1-10; or mild, moderate, severe)       5 or 6 /10 7. ITCH: Does it itch? If Yes, ask: How bad is the itch?      no 8. CAUSE: What do you think caused the swelling?     Breast duct infection 9 OTHER SYMPTOMS: Do you have any other symptoms? (e.g., fever)     No  Pt has 5 more days of abx to take for mastitis: pt states looks like the lump is getting worse and becoming redder.  Protocols used: Skin Lump or Localized Swelling-A-AH

## 2024-06-07 NOTE — Telephone Encounter (Signed)
 Please see triage msg and advise if anything further is needed at this time

## 2024-06-08 ENCOUNTER — Ambulatory Visit: Admitting: Physician Assistant

## 2024-06-08 ENCOUNTER — Encounter: Payer: Self-pay | Admitting: Family Medicine

## 2024-06-08 ENCOUNTER — Ambulatory Visit (INDEPENDENT_AMBULATORY_CARE_PROVIDER_SITE_OTHER): Admitting: Family Medicine

## 2024-06-08 VITALS — BP 156/94 | HR 115 | Temp 97.7°F | Ht 68.0 in | Wt 203.2 lb

## 2024-06-08 DIAGNOSIS — N61 Mastitis without abscess: Secondary | ICD-10-CM

## 2024-06-08 DIAGNOSIS — R000 Tachycardia, unspecified: Secondary | ICD-10-CM

## 2024-06-08 DIAGNOSIS — F430 Acute stress reaction: Secondary | ICD-10-CM | POA: Diagnosis not present

## 2024-06-08 DIAGNOSIS — N643 Galactorrhea not associated with childbirth: Secondary | ICD-10-CM | POA: Diagnosis not present

## 2024-06-08 NOTE — Progress Notes (Signed)
 Subjective  CC:  Chief Complaint  Patient presents with   lump in breast    HPI: Kelli Perez is a 38 y.o. female who presents to the office today to address the problems listed above in the chief complaint. Discussed the use of AI scribe software for clinical note transcription with the patient, who gave verbal consent to proceed. Same day acute visit; PCP not available. New pt to me. Chart reviewed. I have reviewed last 2 visits regarding dx and treatment of mastitis.   Lab Results  Component Value Date   WBC 12.3 (H) 06/02/2024   HGB 14.0 06/02/2024   HCT 43.2 06/02/2024   MCV 88.7 06/02/2024   PLT 395.0 06/02/2024     History of Present Illness Kelli Perez is a 37 year old female who presents with recurrent mastitis and concerns about a possible breast abscess.  She has experienced recurrent mastitis three times in the past year since weaning her son. Her son occasionally stimulates milk production, which may contribute to the recurrence. Symptoms began around July 25th or 26th, with initial pain and a sensation of a clogged duct, followed by redness and a lump in the breast. Antibiotics started five days ago have improved redness and pain, though the lump persists.  She has a history of overproduction of milk and has experienced mastitis two or three times previously while nursing, typically in the same location. During this episode, she attempted to express milk, resulting in a yellow, thick, milky discharge.  She feels exhausted, attributing it to multiple stressors, including her current separation from her husband and caring for her two children. No fever is present, but she feels flushed. She has been using warm compresses and taking ibuprofen  for symptom relief.  Her primary concern is whether the condition could be an abscess and whether it is normal for the symptoms to last this long. The redness comes and goes, and she is unsure if the lump is an abscess or a  blocked duct.   Assessment  1. Mastitis, right, acute   2. Galactorrhea   3. Tachycardia   4. Stress reaction      Plan  Assessment and Plan Assessment & Plan Mastitis and obstructed milk duct, right breast, improving Mastitis in the right breast with an obstructed milk duct, improving after five days of antibiotics. Redness intermittent, pain decreased, no fever. Differential includes possible cyst or blocked duct. Continued improvement expected. - Continue antibiotics for 10 days. - Apply warm compresses to affected area. - Take ibuprofen  as needed. - Avoid expressing milk. - Perform gentle massage without expressing milk. - Monitor for persistent lump; consider ultrasound if present after 3-8 weeks.  Suspected right breast cyst Possible cyst in the right breast contributing to obstruction and infection. No definitive diagnosis yet, but remains a consideration if symptoms persist. - Follow up with OB if symptoms persist. - Consider ultrasound if lump persists after 3-8 weeks.  Fatigue Fatigue likely multifactorial, related to infection, stress, and personal circumstances. - Encourage rest and stress reduction. - Advise on relaxation and activity reduction for recovery.    Follow up: prn No orders of the defined types were placed in this encounter.  No orders of the defined types were placed in this encounter.    I reviewed the patients updated PMH, FH, and SocHx.  Patient Active Problem List   Diagnosis Date Noted   PMDD (premenstrual dysphoric disorder) 04/17/2023   Vitamin D  deficiency 04/17/2023   Ectopic pregnancy without intrauterine  pregnancy 01/26/2022   Current Meds  Medication Sig   clindamycin  (CLEOCIN ) 300 MG capsule Take 1 capsule (300 mg total) by mouth 4 (four) times daily for 10 days.   ibuprofen  (ADVIL ) 600 MG tablet TK 1 T PO Q 6 H   lisdexamfetamine (VYVANSE ) 50 MG capsule Take 1 capsule (50 mg total) by mouth daily.   Magnesium Glycinate 100 MG  CAPS Take 120 mg by mouth.   propranolol  (INDERAL ) 20 MG tablet Take 1 tablet (20 mg total) by mouth 2 (two) times daily.   sertraline  (ZOLOFT ) 50 MG tablet Take 1 tablet (50 mg total) by mouth daily.   Vitamin D -Vitamin K (VITAMIN K2-VITAMIN D3 PO)    Allergies: Patient is allergic to cefprozil, cephalosporins, penicillins, sulfa antibiotics, and sulfamethoxazole. Family History: Patient family history is not on file. Social History:  Patient  reports that she has never smoked. She has never used smokeless tobacco. She reports that she does not currently use alcohol. She reports that she does not use drugs.  Review of Systems: Constitutional: Negative for fever malaise or anorexia Cardiovascular: negative for chest pain Respiratory: negative for SOB or persistent cough Gastrointestinal: negative for abdominal pain  Objective  Vitals: BP (!) 156/94   Pulse (!) 115   Temp 97.7 F (36.5 C)   Ht 5' 8 (1.727 m)   Wt 203 lb 3.2 oz (92.2 kg)   SpO2 99%   BMI 30.90 kg/m  General: no acute distress but anxious appearing, A&Ox3 HEENT: PEERL, conjunctiva normal, neck is supple Cardiovascular:  tachy without murmur or gallop.  Right breast: no redness, nl appearing. No galactorrhea, at 1:00 approx 1.5cm mobile nontender cystic like area Skin:  Warm, no rashes Commons side effects, risks, benefits, and alternatives for medications and treatment plan prescribed today were discussed, and the patient expressed understanding of the given instructions. Patient is instructed to call or message via MyChart if he/she has any questions or concerns regarding our treatment plan. No barriers to understanding were identified. We discussed Red Flag symptoms and signs in detail. Patient expressed understanding regarding what to do in case of urgent or emergency type symptoms.  Medication list was reconciled, printed and provided to the patient in AVS. Patient instructions and summary information was reviewed  with the patient as documented in the AVS. This note was prepared with assistance of Dragon voice recognition software. Occasional wrong-word or sound-a-like substitutions may have occurred due to the inherent limitations of voice recognition software

## 2024-06-08 NOTE — Telephone Encounter (Signed)
 Noted; pt scheduled to see Kelli Perez today

## 2024-06-09 DIAGNOSIS — F331 Major depressive disorder, recurrent, moderate: Secondary | ICD-10-CM | POA: Diagnosis not present

## 2024-06-09 DIAGNOSIS — F4310 Post-traumatic stress disorder, unspecified: Secondary | ICD-10-CM | POA: Diagnosis not present

## 2024-06-09 DIAGNOSIS — F411 Generalized anxiety disorder: Secondary | ICD-10-CM | POA: Diagnosis not present

## 2024-06-16 ENCOUNTER — Ambulatory Visit: Admitting: Physician Assistant

## 2024-06-16 DIAGNOSIS — F331 Major depressive disorder, recurrent, moderate: Secondary | ICD-10-CM | POA: Diagnosis not present

## 2024-06-16 DIAGNOSIS — F411 Generalized anxiety disorder: Secondary | ICD-10-CM | POA: Diagnosis not present

## 2024-06-16 DIAGNOSIS — F4310 Post-traumatic stress disorder, unspecified: Secondary | ICD-10-CM | POA: Diagnosis not present

## 2024-06-22 ENCOUNTER — Telehealth

## 2024-06-22 DIAGNOSIS — R3989 Other symptoms and signs involving the genitourinary system: Secondary | ICD-10-CM | POA: Diagnosis not present

## 2024-07-05 HISTORY — PX: OTHER SURGICAL HISTORY: SHX169

## 2024-07-06 ENCOUNTER — Other Ambulatory Visit (HOSPITAL_BASED_OUTPATIENT_CLINIC_OR_DEPARTMENT_OTHER): Payer: Self-pay

## 2024-07-06 MED ORDER — LISDEXAMFETAMINE DIMESYLATE 50 MG PO CAPS
50.0000 mg | ORAL_CAPSULE | Freq: Every day | ORAL | 0 refills | Status: DC
  Filled 2024-07-06: qty 30, 30d supply, fill #0

## 2024-07-26 DIAGNOSIS — R000 Tachycardia, unspecified: Secondary | ICD-10-CM

## 2024-07-27 NOTE — Progress Notes (Signed)
 Monitor ok

## 2024-07-29 DIAGNOSIS — H43813 Vitreous degeneration, bilateral: Secondary | ICD-10-CM | POA: Diagnosis not present

## 2024-07-29 DIAGNOSIS — H33322 Round hole, left eye: Secondary | ICD-10-CM | POA: Diagnosis not present

## 2024-07-29 DIAGNOSIS — H35412 Lattice degeneration of retina, left eye: Secondary | ICD-10-CM | POA: Diagnosis not present

## 2024-08-04 ENCOUNTER — Other Ambulatory Visit (HOSPITAL_BASED_OUTPATIENT_CLINIC_OR_DEPARTMENT_OTHER): Payer: Self-pay

## 2024-08-04 MED ORDER — LISDEXAMFETAMINE DIMESYLATE 50 MG PO CAPS
50.0000 mg | ORAL_CAPSULE | Freq: Every day | ORAL | 0 refills | Status: DC
  Filled 2024-08-04: qty 90, 90d supply, fill #0

## 2024-08-05 DIAGNOSIS — H33322 Round hole, left eye: Secondary | ICD-10-CM | POA: Diagnosis not present

## 2024-08-18 ENCOUNTER — Other Ambulatory Visit: Payer: Self-pay | Admitting: Medical Genetics

## 2024-08-18 DIAGNOSIS — Z006 Encounter for examination for normal comparison and control in clinical research program: Secondary | ICD-10-CM

## 2024-08-19 DIAGNOSIS — R55 Syncope and collapse: Secondary | ICD-10-CM | POA: Insufficient documentation

## 2024-08-23 ENCOUNTER — Ambulatory Visit: Payer: Self-pay | Admitting: Physician Assistant

## 2024-08-23 ENCOUNTER — Ambulatory Visit: Admitting: Physician Assistant

## 2024-08-23 VITALS — BP 148/98 | HR 101 | Temp 98.4°F | Ht 68.0 in | Wt 202.0 lb

## 2024-08-23 DIAGNOSIS — R635 Abnormal weight gain: Secondary | ICD-10-CM | POA: Diagnosis not present

## 2024-08-23 DIAGNOSIS — R030 Elevated blood-pressure reading, without diagnosis of hypertension: Secondary | ICD-10-CM

## 2024-08-23 DIAGNOSIS — Z22330 Carrier of Group B streptococcus: Secondary | ICD-10-CM | POA: Insufficient documentation

## 2024-08-23 DIAGNOSIS — Z0000 Encounter for general adult medical examination without abnormal findings: Secondary | ICD-10-CM | POA: Diagnosis not present

## 2024-08-23 DIAGNOSIS — E559 Vitamin D deficiency, unspecified: Secondary | ICD-10-CM | POA: Diagnosis not present

## 2024-08-23 DIAGNOSIS — Z1159 Encounter for screening for other viral diseases: Secondary | ICD-10-CM

## 2024-08-23 LAB — CBC WITH DIFFERENTIAL/PLATELET
Basophils Absolute: 0 K/uL (ref 0.0–0.1)
Basophils Relative: 0.7 % (ref 0.0–3.0)
Eosinophils Absolute: 0.1 K/uL (ref 0.0–0.7)
Eosinophils Relative: 1.5 % (ref 0.0–5.0)
HCT: 42.4 % (ref 36.0–46.0)
Hemoglobin: 14 g/dL (ref 12.0–15.0)
Lymphocytes Relative: 28.7 % (ref 12.0–46.0)
Lymphs Abs: 1.7 K/uL (ref 0.7–4.0)
MCHC: 33 g/dL (ref 30.0–36.0)
MCV: 87.8 fl (ref 78.0–100.0)
Monocytes Absolute: 0.3 K/uL (ref 0.1–1.0)
Monocytes Relative: 5.7 % (ref 3.0–12.0)
Neutro Abs: 3.9 K/uL (ref 1.4–7.7)
Neutrophils Relative %: 63.4 % (ref 43.0–77.0)
Platelets: 354 K/uL (ref 150.0–400.0)
RBC: 4.83 Mil/uL (ref 3.87–5.11)
RDW: 12.8 % (ref 11.5–15.5)
WBC: 6.1 K/uL (ref 4.0–10.5)

## 2024-08-23 LAB — LIPID PANEL
Cholesterol: 159 mg/dL (ref 0–200)
HDL: 41.7 mg/dL (ref >39.00)
LDL Cholesterol: 85 mg/dL (ref 0–99)
NonHDL: 117.68
Total CHOL/HDL Ratio: 4
Triglycerides: 161 mg/dL — ABNORMAL HIGH (ref 0.0–149.0)
VLDL: 32.2 mg/dL (ref 0.0–40.0)

## 2024-08-23 LAB — COMPREHENSIVE METABOLIC PANEL WITH GFR
ALT: 25 U/L (ref 0–35)
AST: 19 U/L (ref 0–37)
Albumin: 4.6 g/dL (ref 3.5–5.2)
Alkaline Phosphatase: 44 U/L (ref 39–117)
BUN: 14 mg/dL (ref 6–23)
CO2: 26 meq/L (ref 19–32)
Calcium: 9.5 mg/dL (ref 8.4–10.5)
Chloride: 103 meq/L (ref 96–112)
Creatinine, Ser: 0.83 mg/dL (ref 0.40–1.20)
GFR: 89.46 mL/min (ref >60.00)
Glucose, Bld: 79 mg/dL (ref 70–99)
Potassium: 4 meq/L (ref 3.5–5.1)
Sodium: 139 meq/L (ref 135–145)
Total Bilirubin: 0.4 mg/dL (ref 0.2–1.2)
Total Protein: 7 g/dL (ref 6.0–8.3)

## 2024-08-23 LAB — TSH: TSH: 1.91 u[IU]/mL (ref 0.35–5.50)

## 2024-08-23 LAB — HEMOGLOBIN A1C: Hgb A1c MFr Bld: 5.5 % (ref 4.6–6.5)

## 2024-08-23 LAB — VITAMIN D 25 HYDROXY (VIT D DEFICIENCY, FRACTURES): VITD: 58.77 ng/mL (ref 30.00–100.00)

## 2024-08-23 NOTE — Progress Notes (Signed)
 Patient ID: Kelli Perez, female    DOB: 03-08-1986, 38 y.o.   MRN: [id number]   Assessment & Plan:  Annual physical exam -     CBC with Differential/Platelet -     Comprehensive metabolic panel with GFR -     Hemoglobin A1c -     Lipid panel -     TSH  Need for hepatitis C screening test -     Hepatitis C antibody  Vitamin D  deficiency -     VITAMIN D  25 Hydroxy (Vit-D Deficiency, Fractures)  Abnormal weight gain -     Insulin, random    Assessment & Plan Adult Wellness Visit Routine annual physical examination with recent life stressors including marital issues and homeschooling challenges. Slightly elevated blood pressure. No significant weight gain. - Recheck blood pressure before leaving - Order basic labs including iron, thyroid , B12, and vitamin D  levels - Consider insulin random test to assess for insulin resistance - Encourage self-care and stress management strategies  Elevated BP w/o HTN - Monitor at home, goal is 120/80 - Mychart with updates if trending high at home, consider BP medication  Attention-deficit hyperactivity disorder Managed with Vyvanse  50 mg. Reports increased anxiety and stress potentially related to Vyvanse . Discussed impact on appetite and weight management. - Discuss with psychiatrist about potential adjustment of Vyvanse  dosage - Consider taking Vyvanse  later in the morning to manage appetite and energy levels - Refer to nutritionist for dietary management and evaluation  Depression and Anxiety Disorders Experiencing symptoms related to life stressors. Under psychiatric care. Previously on Zoloft , now only on Vyvanse . Discussed importance of open communication with husband and marriage counseling. - Continue psychiatric follow-up - Encourage open communication with husband and participation in marriage counseling - Promote self-care and stress management strategies  Overweight and Weight Management Struggling with weight management  post-pregnancy. Discussed impact of stress, sleep disturbance, and medication on weight. No significant recent weight gain. - Refer to nutritionist for dietary management and evaluation - Encourage regular physical activity and healthy eating habits - Discuss potential impact of stress and sleep on weight management  Sleep disturbance Poor sleep quality due to children co-sleeping and stress. Discussed impact on overall health and weight management. - Encourage establishing a consistent sleep routine for children - Promote good sleep hygiene practices  Vitamin D  deficiency Low vitamin D  levels. Currently taking supplements. - Check vitamin D  levels  Age-appropriate screening and counseling performed today. Will check labs and call with results. Preventive measures discussed and printed in AVS for patient.   Patient Counseling: [x]   Nutrition: Stressed importance of moderation in sodium/caffeine intake, saturated fat and cholesterol, caloric balance, sufficient intake of fresh fruits, vegetables, and fiber.  [x]   Stressed the importance of regular exercise.   [x]   Substance Abuse: Discussed cessation/primary prevention of tobacco, alcohol, or other drug use; driving or other dangerous activities under the influence; availability of treatment for abuse.   [x]   Injury prevention: Discussed safety belts, safety helmets, smoke detector, smoking near bedding or upholstery.   []   Sexuality: Discussed sexually transmitted diseases, partner selection, use of condoms, avoidance of unintended pregnancy  and contraceptive alternatives.   [x]   Dental health: Discussed importance of regular tooth brushing, flossing, and dental visits.  [x]   Health maintenance and immunizations reviewed. Please refer to Health maintenance section.          Return in about 4 months (around 12/24/2024) for recheck/follow-up.    Subjective:    Chief Complaint  Patient presents with   Annual Exam    Here to see  pcp for annual PE. Did not fast for lab draws today.     HPI Discussed the use of AI scribe software for clinical note transcription with the patient, who gave verbal consent to proceed.  History of Present Illness Kelli Perez is a 38 year old female who presents for her regular annual physical.  She has been experiencing significant stress and anxiety over the past year, primarily due to personal and family issues, including separating and reconciling with her husband. She fainted twice due to stress when her children had recurrent croup. She experiences symptoms of anxiety and depression but has no thoughts of self-harm. She is under psychiatric care for ADHD medication but finds her therapy sessions ineffective.  She has a history of ADHD and is currently taking Vyvanse  50 mg daily. She previously took Zoloft  but discontinued it due to lack of weight loss and feeling 'stuck'. Vyvanse  helps her feel better but causes reduced daytime appetite and increased nighttime hunger, leading to consuming most calories in the evening.  Her menstrual cycles are normal, and previous hormone tests were normal. She has not experienced significant weight gain but is frustrated with her inability to lose weight despite being active. She wonders if her symptoms could be related to perimenopause but is unsure.  Her sleep is disrupted as her children often end up in her bed, affecting her rest. She acknowledges not getting the best sleep and feels tired. She does not consume alcohol or use tobacco products.  She is taking a vitamin D  supplement due to previously low levels. No current skin issues, but she has had moles checked in the past. Family history includes testicular cancer on her father's side. She denies any personal history of gestational diabetes but notes her second child was large at birth and she experienced significant nausea and vomiting during the last trimester.     Past Medical History:   Diagnosis Date   Anemia    Anxiety    Asthma    Complication of anesthesia    Depressive disorder 09/11/2017   well-controlled   Ectopic pregnancy without intrauterine pregnancy 01/26/2022   GERD (gastroesophageal reflux disease)    Medical history non-contributory    PONV (postoperative nausea and vomiting)    Seizures (HCC)    SVD (spontaneous vaginal delivery) 05/22/2018    Past Surgical History:  Procedure Laterality Date   DIAGNOSTIC LAPAROSCOPY WITH REMOVAL OF ECTOPIC PREGNANCY N/A 01/25/2022   Procedure: DIAGNOSTIC LAPAROSCOPY WITH REMOVAL OF ECTOPIC PREGNANCY;  Surgeon: Barbette Knock, MD;  Location: [facility] OR;  Service: Gynecology;  Laterality: N/A;   lazer eye surgery Left 07/2024   SKIN SURGERY     birthmark removal on back    Family History  Problem Relation Age of Onset   Anxiety disorder Mother    Depression Mother    Obesity Mother    Varicose Veins Mother    Arthritis Father    Cancer Father        testicular   ADD / ADHD Sister    ADD / ADHD Brother    COPD Maternal Grandmother    Varicose Veins Maternal Grandmother    Cancer Maternal Grandfather    Asthma Paternal Grandmother    ADD / ADHD Son    Asthma Son    Asthma Son    Depression Maternal Aunt    Miscarriages / Stillbirths Maternal Aunt    Obesity Maternal Aunt  Varicose Veins Maternal Aunt    Arthritis Maternal Aunt    Miscarriages / Stillbirths Maternal Aunt    Obesity Maternal Aunt    Varicose Veins Maternal Aunt    Colon cancer Neg Hx    Breast cancer Neg Hx     Social History   Tobacco Use   Smoking status: Former   Smokeless tobacco: Never  Substance Use Topics   Alcohol use: Not Currently   Drug use: Never     Allergies  Allergen Reactions   Cefprozil Other (See Comments)    Uknown  Cefzil   Cephalosporins Other (See Comments)   Penicillins Other (See Comments)    Unknown  Product containing penicillin (product)   Sulfa Antibiotics Other (See Comments)    Uknown  reaction.  Substance with sulfonamide structure and antibacterial mechanism of action (substance)   Sulfamethoxazole Other (See Comments)    Review of Systems NEGATIVE UNLESS OTHERWISE INDICATED IN HPI      Objective:     BP (!) 148/98 (BP Location: Left Arm, Patient Position: Sitting, Cuff Size: Normal)   Pulse (!) 101   Temp 98.4 F (36.9 C) (Tympanic)   Ht 5' 8 (1.727 m)   Wt 202 lb (91.6 kg)   LMP 08/12/2024   SpO2 99%   Breastfeeding No   BMI 30.71 kg/m   Wt Readings from Last 3 Encounters:  08/23/24 202 lb (91.6 kg)  06/08/24 203 lb 3.2 oz (92.2 kg)  06/02/24 199 lb 8 oz (90.5 kg)    BP Readings from Last 3 Encounters:  08/23/24 (!) 148/98  06/08/24 (!) 156/94  06/02/24 132/89     Physical Exam Vitals and nursing note reviewed.  Constitutional:      Appearance: Normal appearance. She is obese. She is not toxic-appearing.  HENT:     Head: Normocephalic and atraumatic.     Right Ear: Tympanic membrane, ear canal and external ear normal.     Left Ear: Tympanic membrane, ear canal and external ear normal.     Nose: Nose normal.     Mouth/Throat:     Mouth: Mucous membranes are moist.  Eyes:     Extraocular Movements: Extraocular movements intact.     Conjunctiva/sclera: Conjunctivae normal.     Pupils: Pupils are equal, round, and reactive to light.  Cardiovascular:     Rate and Rhythm: Normal rate and regular rhythm.     Pulses: Normal pulses.     Heart sounds: Normal heart sounds.  Pulmonary:     Effort: Pulmonary effort is normal.     Breath sounds: Normal breath sounds.  Abdominal:     General: Abdomen is flat. Bowel sounds are normal.     Palpations: Abdomen is soft.  Musculoskeletal:        General: Normal range of motion.     Cervical back: Normal range of motion and neck supple.  Skin:    General: Skin is warm and dry.  Neurological:     General: No focal deficit present.     Mental Status: She is alert and oriented to person, place,  and time.  Psychiatric:        Mood and Affect: Mood is anxious. Affect is tearful.             Kataleya Zaugg M Elverta Dimiceli, PA-C

## 2024-08-24 LAB — INSULIN, RANDOM: Insulin: 12.8 u[IU]/mL

## 2024-08-24 LAB — HEPATITIS C ANTIBODY: Hepatitis C Ab: NONREACTIVE

## 2024-08-24 NOTE — Patient Instructions (Signed)
  VISIT SUMMARY: Today, you had your annual physical examination. We discussed your recent stress and anxiety, slightly elevated blood pressure, ADHD management, and weight management challenges. We also reviewed your sleep quality, vitamin D  levels, and skin tags.  YOUR PLAN: ADULT WELLNESS VISIT: Routine annual physical examination with recent life stressors including marital issues and homeschooling challenges. Slightly elevated blood pressure. No significant weight gain. -Recheck blood pressure before leaving. -Order basic labs including iron, thyroid , B12, and vitamin D  levels. -Consider insulin random test to assess for insulin resistance. -Encourage self-care and stress management strategies.  HYPERTENSION: Slightly elevated blood pressure during visit. Unspecified hypertension listed among current diagnoses. -Recheck blood pressure before leaving. -Monitor blood pressure regularly.  ATTENTION-DEFICIT HYPERACTIVITY DISORDER: Managed with Vyvanse  50 mg. Reports increased anxiety and stress potentially related to Vyvanse . Discussed impact on appetite and weight management. -Discuss with psychiatrist about potential adjustment of Vyvanse  dosage. -Consider taking Vyvanse  later in the morning to manage appetite and energy levels. -Refer to nutritionist for dietary management and evaluation.  DEPRESSION AND ANXIETY DISORDERS: Experiencing symptoms related to life stressors. Under psychiatric care. Previously on Zoloft , now only on Vyvanse . Discussed importance of open communication with husband and marriage counseling. -Continue psychiatric follow-up. -Encourage open communication with husband and participation in marriage counseling. -Promote self-care and stress management strategies.  OVERWEIGHT AND WEIGHT MANAGEMENT: Struggling with weight management post-pregnancy. Discussed impact of stress, sleep disturbance, and medication on weight. No significant recent weight gain. -Refer to  nutritionist for dietary management and evaluation. -Encourage regular physical activity and healthy eating habits. -Discuss potential impact of stress and sleep on weight management.  SLEEP DISTURBANCE: Poor sleep quality due to children co-sleeping and stress. Discussed impact on overall health and weight management. -Encourage establishing a consistent sleep routine for children. -Promote good sleep hygiene practices.  VITAMIN D  DEFICIENCY: Low vitamin D  levels. Currently taking supplements. -Check vitamin D  levels.  SKIN TAGS: Presence of skin tags, primarily under arms. -Monitor for any changes in skin tags. -Discuss cosmetic removal options if desired.                      Contains text generated by [employer]bridge.                                 Contains text generated by [employer]bridge.

## 2024-08-30 DIAGNOSIS — Z713 Dietary counseling and surveillance: Secondary | ICD-10-CM | POA: Diagnosis not present

## 2024-09-07 ENCOUNTER — Other Ambulatory Visit: Payer: Self-pay | Admitting: Family Medicine

## 2024-09-08 ENCOUNTER — Other Ambulatory Visit (HOSPITAL_BASED_OUTPATIENT_CLINIC_OR_DEPARTMENT_OTHER): Payer: Self-pay

## 2024-09-08 DIAGNOSIS — F902 Attention-deficit hyperactivity disorder, combined type: Secondary | ICD-10-CM | POA: Diagnosis not present

## 2024-09-08 DIAGNOSIS — Z79899 Other long term (current) drug therapy: Secondary | ICD-10-CM | POA: Diagnosis not present

## 2024-09-08 MED ORDER — PROPRANOLOL HCL 20 MG PO TABS
20.0000 mg | ORAL_TABLET | Freq: Two times a day (BID) | ORAL | 0 refills | Status: DC
  Filled 2024-09-08: qty 60, 30d supply, fill #0

## 2024-09-13 ENCOUNTER — Ambulatory Visit: Payer: Self-pay

## 2024-09-13 ENCOUNTER — Encounter: Payer: Self-pay | Admitting: Physician Assistant

## 2024-09-13 NOTE — Telephone Encounter (Signed)
 Patient called back stating she has a prescription for Propanolol and was unsure if she should be taking it or not. Patient states that Alyssa did not originally prescribe this medication and is requesting some clarification. Please advise.

## 2024-09-13 NOTE — Telephone Encounter (Signed)
 FYI Only or Action Required?: FYI only for provider: appointment scheduled on 11.12.25.  Patient was last seen in primary care on 08/23/2024 by Allwardt, Mardy HERO, PA-C.  Called Nurse Triage reporting Palpitations.  Symptoms began several months ago.  Interventions attempted: Other: decreasing vyvanse .  Symptoms are: unchanged.  Triage Disposition: See PCP When Office is Open (Within 3 Days)  Patient/caregiver understands and will follow disposition?: Yes    Copied from CRM [id number]. Topic: Clinical - Red Word Triage >> Sep 13, 2024  3:07 PM Viola F wrote: Patient having shortness of breath, heart racing and blood pressure 141/89 , provider recommended appt sometime this week or ER if worsening symptoms (per MyChart messages) Reason for Disposition  [1] ADHD AND [2] taking stimulant medicine  Answer Assessment - Initial Assessment Questions Episodes heart racing, has a feeling like she is going to faint. Had a fainting episode a couple weeks ago. Child woke up in the middle of the night, croup coughing, got up quickly and grabbed his nebulizer, felt really sick and broke out in a sweat, passed out, woke up and passed out again.  Shortness of breath with the heart racing. Feels like it is worse with her stress. She states she has been taking the reduce dose of the vyvanse  and feels just off.  RN did schedule pt this week per the fpl group from provider. RN did give lots of education on how to prevent this, drinking fluids, eating right, sleeping well, stand up slowly. RN also advised if she feels like she is going to pass out, to go ahead and sit or lay down and prop her feet up until the feeling passes and then sit up slowly to see how she is feeling. Pt was very grateful for all the information.    1. DESCRIPTION: Please describe your heart rate or heartbeat that you are having (e.g., fast/slow, regular/irregular, skipped or extra beats, palpitations)     Heart racing,  increased BP, dizziness, fainting 2. ONSET: When did it start? (e.g., minutes, hours, days)      Pt states ongoing 3. DURATION: How long does it last (e.g., seconds, minutes, hours)     Not currently 4. PATTERN Does it come and go, or has it been constant since it started?  Does it get worse with exertion?   Are you feeling it now?     Intermittent she feels like it is worse with her stress.   7. RECURRENT SYMPTOM: Have you ever had this before? If Yes, ask: When was the last time? and What happened that time?      Yes been ongoing 8. CAUSE: What do you think is causing the palpitations?     unknown 9. CARDIAC HISTORY: Do you have any history of heart disease? (e.g., heart attack, angina, bypass surgery, angioplasty, arrhythmia)      no 10. OTHER SYMPTOMS: Do you have any other symptoms? (e.g., dizziness, chest pain, sweating, difficulty breathing)       Dizziness, passes out, sweaty, shortness of breath with these episodes.  Protocols used: Heart Rate and Heartbeat Questions-A-AH

## 2024-09-13 NOTE — Telephone Encounter (Signed)
 Please see provider message and contact patient to try to get in the office this week to be seen. Thanks

## 2024-09-13 NOTE — Telephone Encounter (Signed)
 Please see pt concern and advise, looks like E2C2 scheduled pt with Dr Jodie for this week after front office lvm for patient to call us  back to schedule a visit with you this week.

## 2024-09-13 NOTE — Telephone Encounter (Signed)
 Please see pt msg and advise

## 2024-09-13 NOTE — Telephone Encounter (Signed)
 LVM TO PT TO HAVE HER SCHEDULE WITH US  THIS WEEK.

## 2024-09-14 NOTE — Telephone Encounter (Signed)
 Noted and agreed, thank you.

## 2024-09-15 ENCOUNTER — Ambulatory Visit: Admitting: Family Medicine

## 2024-09-16 ENCOUNTER — Ambulatory Visit: Admitting: Physician Assistant

## 2024-09-16 ENCOUNTER — Encounter: Payer: Self-pay | Admitting: Physician Assistant

## 2024-09-16 ENCOUNTER — Other Ambulatory Visit (HOSPITAL_BASED_OUTPATIENT_CLINIC_OR_DEPARTMENT_OTHER): Payer: Self-pay

## 2024-09-16 VITALS — BP 136/82 | HR 110 | Temp 98.4°F | Ht 68.0 in | Wt 202.2 lb

## 2024-09-16 DIAGNOSIS — R000 Tachycardia, unspecified: Secondary | ICD-10-CM

## 2024-09-16 DIAGNOSIS — R030 Elevated blood-pressure reading, without diagnosis of hypertension: Secondary | ICD-10-CM | POA: Diagnosis not present

## 2024-09-16 DIAGNOSIS — F3281 Premenstrual dysphoric disorder: Secondary | ICD-10-CM | POA: Diagnosis not present

## 2024-09-16 DIAGNOSIS — F909 Attention-deficit hyperactivity disorder, unspecified type: Secondary | ICD-10-CM | POA: Diagnosis not present

## 2024-09-16 MED ORDER — PROPRANOLOL HCL 20 MG PO TABS: 20.0000 mg | ORAL_TABLET | Freq: Every day | ORAL | 2 refills | Status: AC

## 2024-09-16 MED ORDER — LISDEXAMFETAMINE DIMESYLATE 30 MG PO CAPS: 30.0000 mg | ORAL_CAPSULE | Freq: Every day | ORAL | 0 refills | Status: DC

## 2024-09-16 NOTE — Progress Notes (Signed)
 Patient ID: Kelli Perez, female    DOB: 10-Sep-1986, 38 y.o.   MRN: [id number]   Assessment & Plan:  Attention deficit hyperactivity disorder (ADHD), unspecified ADHD type -     Lisdexamfetamine Dimesylate ; Take 1 capsule (30 mg total) by mouth daily.  Dispense: 30 capsule; Refill: 0  Tachycardia -     Propranolol  HCl; Take 1 tablet (20 mg total) by mouth daily.  Dispense: 30 tablet; Refill: 2  PMDD (premenstrual dysphoric disorder)  Elevated blood pressure reading without diagnosis of hypertension      Assessment & Plan Attention-deficit hyperactivity disorder ADHD symptoms exacerbated by Vyvanse , leading to emotional dysregulation and increased anxiety. Current dose of Vyvanse  is too high, causing adverse effects. She prefers to avoid splitting capsules due to sensitivity and inconsistency in dosing. Propranolol  prescribed for situational anxiety and tachycardia. - Prescribed Vyvanse  30 mg once daily. - Will consider adding a low-dose SSRI if symptoms persist, especially during winter months. - Continue weekly individual therapy sessions.  Premenstrual dysphoric disorder Symptoms worsen premenstrually, with increased anxiety and emotional dysregulation. Stress and dietary factors, particularly sugar intake, exacerbate symptoms. She is focusing on dietary changes to manage symptoms. - Continue dietary modifications, including reducing sugar intake. - Monitor symptoms and adjust treatment as needed.  Tachycardia and elevated blood pressure Intermittent tachycardia and elevated blood pressure, possibly exacerbated by Vyvanse  and stress. Previous heart monitor showed no significant arrhythmias. Blood pressure goal is 120/80 mmHg. Propranolol  prescribed for situational use to manage symptoms. - Prescribed propranolol  for situational use to manage tachycardia and anxiety. - Monitor blood pressure regularly, aiming for 120/80 mmHg. - Will consider cardiology referral if blood pressure  remains elevated despite medication adjustments.      Return in about 4 weeks (around 10/14/2024) for recheck/follow-up, blood pressure check.    Subjective:    Chief Complaint  Patient presents with   Shortness of Breath    Pt states it has got better but still having racing heart rate and some palpitations. Pt acknowledges she thinks this has been going on for a long time but never really knew what it was. Pt admits she had several days of stress induced issues, only taking 1/2 of her prescribed Vyvanse  per psychiatrist and not taking propanolol until speaking with PCP today     HPI Discussed the use of AI scribe software for clinical note transcription with the patient, who gave verbal consent to proceed.  History of Present Illness Kelli Perez is a 38 year old female who presents with episodes of tachycardia and dizziness.  She experiences episodes of tachycardia and dizziness, often associated with stressful events and her menstrual cycle. These episodes are described as feeling like 'panic' and are accompanied by flushing and a sense of urgency. A recent argument with her husband exacerbated these symptoms.  She has been taking Vyvanse , initially at a higher dose, which was temporarily stopped. Upon resuming at a reduced dose, she experienced emotional instability, including crying and feeling out of control. She is currently taking approximately half the dose and feels this may be more appropriate. While Vyvanse  helps with emotional regulation, it also makes her overly focused, impacting her ability to engage with her children.  She has a history of elevated blood pressure readings, which she attributes to stress and possibly Vyvanse . She notes it tends to be high during office visits.  She is focusing on improving her health through diet, particularly by reducing sugar intake, which she believes exacerbates her symptoms. She  is also engaged in therapy, both individual and  with her husband, to address stressors at home.  She has been prescribed propranolol  to manage her symptoms, particularly during high-stress periods or premenstrual times. She is cautious about using it due to concerns about fainting, given her sensitivity to medications.     Past Medical History:  Diagnosis Date   Anemia    Anxiety    Asthma    Complication of anesthesia    Depressive disorder 09/11/2017   well-controlled   Ectopic pregnancy without intrauterine pregnancy 01/26/2022   GERD (gastroesophageal reflux disease)    Medical history non-contributory    PONV (postoperative nausea and vomiting)    Seizures (HCC)    SVD (spontaneous vaginal delivery) 05/22/2018    Past Surgical History:  Procedure Laterality Date   DIAGNOSTIC LAPAROSCOPY WITH REMOVAL OF ECTOPIC PREGNANCY N/A 01/25/2022   Procedure: DIAGNOSTIC LAPAROSCOPY WITH REMOVAL OF ECTOPIC PREGNANCY;  Surgeon: Barbette Knock, MD;  Location: [facility] OR;  Service: Gynecology;  Laterality: N/A;   lazer eye surgery Left 07/2024   SKIN SURGERY     birthmark removal on back    Family History  Problem Relation Age of Onset   Anxiety disorder Mother    Depression Mother    Obesity Mother    Varicose Veins Mother    Arthritis Father    Cancer Father        testicular   ADD / ADHD Sister    ADD / ADHD Brother    COPD Maternal Grandmother    Varicose Veins Maternal Grandmother    Cancer Maternal Grandfather    Asthma Paternal Grandmother    ADD / ADHD Son    Asthma Son    Asthma Son    Depression Maternal Aunt    Miscarriages / Stillbirths Maternal Aunt    Obesity Maternal Aunt    Varicose Veins Maternal Aunt    Arthritis Maternal Aunt    Miscarriages / Stillbirths Maternal Aunt    Obesity Maternal Aunt    Varicose Veins Maternal Aunt    Colon cancer Neg Hx    Breast cancer Neg Hx     Social History   Tobacco Use   Smoking status: Former   Smokeless tobacco: Never  Substance Use Topics   Alcohol use: Not  Currently   Drug use: Never     Allergies  Allergen Reactions   Cefprozil Other (See Comments)    Uknown  Cefzil   Cephalosporins Other (See Comments)   Penicillins Other (See Comments)    Unknown  Product containing penicillin (product)   Sulfa Antibiotics Other (See Comments)    Uknown reaction.  Substance with sulfonamide structure and antibacterial mechanism of action (substance)   Sulfamethoxazole Other (See Comments)    Review of Systems NEGATIVE UNLESS OTHERWISE INDICATED IN HPI      Objective:     BP 136/82 (BP Location: Right Arm, Patient Position: Sitting, Cuff Size: Normal)   Pulse (!) 110   Temp 98.4 F (36.9 C) (Temporal)   Ht 5' 8 (1.727 m)   Wt 202 lb 3.2 oz (91.7 kg)   LMP 09/08/2024 (Exact Date)   SpO2 100%   BMI 30.74 kg/m   Wt Readings from Last 3 Encounters:  09/16/24 202 lb 3.2 oz (91.7 kg)  08/23/24 202 lb (91.6 kg)  06/08/24 203 lb 3.2 oz (92.2 kg)    BP Readings from Last 3 Encounters:  09/16/24 136/82  08/23/24 (!) 148/98  06/08/24 (!) 156/94  Physical Exam Vitals and nursing note reviewed.  Constitutional:      Appearance: Normal appearance. She is obese. She is not toxic-appearing.  HENT:     Head: Normocephalic and atraumatic.     Right Ear: Tympanic membrane, ear canal and external ear normal.     Left Ear: Tympanic membrane, ear canal and external ear normal.     Nose: Nose normal.     Mouth/Throat:     Mouth: Mucous membranes are moist.  Eyes:     Extraocular Movements: Extraocular movements intact.     Conjunctiva/sclera: Conjunctivae normal.     Pupils: Pupils are equal, round, and reactive to light.  Cardiovascular:     Rate and Rhythm: Regular rhythm. Tachycardia present.     Heart sounds: Normal heart sounds. No murmur heard. Pulmonary:     Effort: Pulmonary effort is normal.     Breath sounds: Normal breath sounds.  Abdominal:     General: Abdomen is flat. Bowel sounds are normal.     Palpations:  Abdomen is soft.  Musculoskeletal:        General: Normal range of motion.     Cervical back: Normal range of motion and neck supple.  Skin:    General: Skin is warm and dry.     Findings: No rash.  Neurological:     General: No focal deficit present.     Mental Status: She is alert and oriented to person, place, and time.  Psychiatric:        Mood and Affect: Mood normal.        Behavior: Behavior normal.        Thought Content: Thought content normal.        Judgment: Judgment normal.             Shandiin Eisenbeis M Paz Fuentes, PA-C

## 2024-09-16 NOTE — Patient Instructions (Signed)
  VISIT SUMMARY: During your visit, we discussed your episodes of tachycardia and dizziness, which are often linked to stress and your menstrual cycle. We also reviewed your current medications and their effects, and we talked about your efforts to improve your health through diet and therapy.  YOUR PLAN: ATTENTION-DEFICIT HYPERACTIVITY DISORDER: Your ADHD symptoms are being affected by your current dose of Vyvanse , leading to emotional instability and increased anxiety. -Take Vyvanse  30 mg once daily. -If symptoms persist, especially during winter, we may consider adding a low-dose SSRI. -Continue with your weekly individual therapy sessions.  PREMENSTRUAL DYSPHORIC DISORDER: Your symptoms worsen before your menstrual cycle, with increased anxiety and emotional instability. Stress and diet, especially sugar intake, can make these symptoms worse. -Continue with your dietary changes, including reducing sugar intake. -Monitor your symptoms and we will adjust your treatment as needed.  TACHYCARDIA AND ELEVATED BLOOD PRESSURE: You have intermittent episodes of rapid heart rate and high blood pressure, which may be worsened by Vyvanse  and stress. -Take propranolol  as needed to manage your rapid heart rate and anxiety. -Monitor your blood pressure regularly, aiming for 120/80 mmHg. -If your blood pressure remains high despite medication adjustments, we may refer you to a cardiologist.                      Contains text generated by Abridge.                                 Contains text generated by Abridge.

## 2024-09-24 ENCOUNTER — Encounter (HOSPITAL_BASED_OUTPATIENT_CLINIC_OR_DEPARTMENT_OTHER): Payer: Self-pay

## 2024-09-26 NOTE — Progress Notes (Deleted)
  Cardiology Office Note:  .   Date:  09/26/2024  ID:  Kelli Perez, DOB May 14, 1986, MRN [id number] PCP: Allwardt, Mardy HERO, PA-C  [facility] Providers Cardiologist:  None   History of Present Illness: .   No chief complaint on file.   Kelli Perez is a 38 y.o. female with history of ADHD who presents for the evaluation of tachycardia at the request of Allwardt, Alyssa M, PA-C.     Problem List ADHD    ROS: All other ROS reviewed and negative. Pertinent positives noted in the HPI.     Studies Reviewed: .       Zio 07/26/2024 HR 68 - 164, average 100 bpm.   No atrial fibrillation detected. Rare supraventricular ectopy. Rare ventricular ectopy. No sustained arrhythmias. Symptom trigger episodes correspond to sinus rhythm Physical Exam:   VS:  LMP 09/08/2024 (Exact Date)    Wt Readings from Last 3 Encounters:  09/16/24 202 lb 3.2 oz (91.7 kg)  08/23/24 202 lb (91.6 kg)  06/08/24 203 lb 3.2 oz (92.2 kg)    GEN: Well nourished, well developed in no acute distress NECK: No JVD; No carotid bruits CARDIAC: ***RRR, no murmurs, rubs, gallops RESPIRATORY:  Clear to auscultation without rales, wheezing or rhonchi  ABDOMEN: Soft, non-tender, non-distended EXTREMITIES:  No edema; No deformity  ASSESSMENT AND PLAN: .   ***    {Are you ordering a CV Procedure (e.g. stress test, cath, DCCV, TEE, etc)?   Press F2        :[id number]}   Follow-up: No follow-ups on file.  Signed, Kelli Perez. Barbaraann, MD, [facility]  [facility]  [address] [place], [place] [address] [phone]  7:00 PM

## 2024-09-27 ENCOUNTER — Ambulatory Visit (HOSPITAL_BASED_OUTPATIENT_CLINIC_OR_DEPARTMENT_OTHER): Admitting: Cardiovascular Disease

## 2024-09-27 DIAGNOSIS — R000 Tachycardia, unspecified: Secondary | ICD-10-CM

## 2024-10-18 ENCOUNTER — Encounter: Payer: Self-pay | Admitting: Physician Assistant

## 2024-10-18 ENCOUNTER — Other Ambulatory Visit: Payer: Self-pay

## 2024-10-18 ENCOUNTER — Other Ambulatory Visit (HOSPITAL_BASED_OUTPATIENT_CLINIC_OR_DEPARTMENT_OTHER): Payer: Self-pay

## 2024-10-18 DIAGNOSIS — F909 Attention-deficit hyperactivity disorder, unspecified type: Secondary | ICD-10-CM

## 2024-10-18 MED ORDER — LISDEXAMFETAMINE DIMESYLATE 30 MG PO CAPS
30.0000 mg | ORAL_CAPSULE | Freq: Every day | ORAL | 0 refills | Status: DC
  Filled 2024-10-18: qty 30, 30d supply, fill #0

## 2024-10-18 NOTE — Telephone Encounter (Signed)
 Last OV: 09/16/24  Next OV: 10/21/24  Last filled: 09/16/24  Quantity: 30

## 2024-10-21 ENCOUNTER — Ambulatory Visit: Admitting: Physician Assistant

## 2024-11-11 ENCOUNTER — Other Ambulatory Visit (HOSPITAL_BASED_OUTPATIENT_CLINIC_OR_DEPARTMENT_OTHER): Payer: Self-pay

## 2024-11-11 ENCOUNTER — Ambulatory Visit (INDEPENDENT_AMBULATORY_CARE_PROVIDER_SITE_OTHER): Admitting: Physician Assistant

## 2024-11-11 ENCOUNTER — Encounter: Payer: Self-pay | Admitting: Physician Assistant

## 2024-11-11 VITALS — BP 130/84 | HR 109 | Temp 99.1°F | Resp 16 | Ht 68.0 in | Wt 203.0 lb

## 2024-11-11 DIAGNOSIS — F3281 Premenstrual dysphoric disorder: Secondary | ICD-10-CM | POA: Diagnosis not present

## 2024-11-11 DIAGNOSIS — F909 Attention-deficit hyperactivity disorder, unspecified type: Secondary | ICD-10-CM

## 2024-11-11 DIAGNOSIS — R000 Tachycardia, unspecified: Secondary | ICD-10-CM | POA: Diagnosis not present

## 2024-11-11 DIAGNOSIS — R030 Elevated blood-pressure reading, without diagnosis of hypertension: Secondary | ICD-10-CM

## 2024-11-11 MED ORDER — LISDEXAMFETAMINE DIMESYLATE 30 MG PO CAPS
30.0000 mg | ORAL_CAPSULE | Freq: Every day | ORAL | 0 refills | Status: AC
  Filled 2024-11-16: qty 30, 30d supply, fill #0

## 2024-11-11 MED ORDER — LISDEXAMFETAMINE DIMESYLATE 30 MG PO CAPS: 30.0000 mg | ORAL_CAPSULE | Freq: Every day | ORAL | 0 refills | Status: AC

## 2024-11-11 NOTE — Patient Instructions (Signed)
" °  VISIT SUMMARY: Today, we discussed your blood pressure, ADHD management, and other health concerns. We reviewed your current medications and made some adjustments to help manage your symptoms better.  YOUR PLAN: ATTENTION-DEFICIT HYPERACTIVITY DISORDER (ADHD): Your ADHD is being managed with Vyvanse  30 mg daily. You have reported improvement in symptoms with this dose. -Continue taking Vyvanse  30 mg daily. -Your Vyvanse  prescription has been refilled. -Schedule a follow-up appointment every three months for medication management.  BORDERLINE HYPERTENSION: Your blood pressure is 130/84 mmHg, which is improved but still borderline high. -Increase your cardiovascular activity to 150 minutes of moderate exercise per week. -Consider using beetroot supplements or juice to help naturally lower your blood pressure.  TACHYCARDIA: You have a fast heart rate, likely due to your Vyvanse  medication. -Take propranolol  20 mg with your Vyvanse  to help manage your heart rate.  PREMENSTRUAL DYSPHORIC DISORDER (PMDD): Your PMDD symptoms have improved with Claritin and Pepcid, but symptoms worsen when you stop these medications during your menstrual period. -Continue taking Claritin and Pepcid to manage your PMDD symptoms.                      Contains text generated by Abridge.                                 Contains text generated by Abridge.   "

## 2024-11-11 NOTE — Progress Notes (Signed)
 "   Patient ID: Kelli Perez, female    DOB: [birth year], 39 y.o.   MRN: [id number]   Assessment & Plan:  Attention deficit hyperactivity disorder (ADHD), unspecified ADHD type -     Lisdexamfetamine  Dimesylate; Take 1 capsule (30 mg total) by mouth daily.  Dispense: 30 capsule; Refill: 0 -     Lisdexamfetamine  Dimesylate; Take 1 capsule (30 mg total) by mouth daily.  Dispense: 30 capsule; Refill: 0 -     Lisdexamfetamine  Dimesylate; Take 1 capsule (30 mg total) by mouth daily.  Dispense: 30 capsule; Refill: 0  Tachycardia  PMDD (premenstrual dysphoric disorder)  Elevated blood pressure reading without diagnosis of hypertension      Assessment and Plan Assessment & Plan Attention-deficit hyperactivity disorder ADHD managed with Vyvanse  30 mg daily. Reports improvement in symptoms with current dose, though experiences anxiety and depression if dose is missed. Current dose is a middle ground that balances symptom control and side effects. - Continue Vyvanse  30 mg daily - Refilled Vyvanse  prescription - Scheduled follow-up every three months for medication management  Borderline hypertension Blood pressure is 130/84 mmHg, improved since last year but still borderline. Lifestyle modifications, including increased cardiovascular activity, are recommended. Discussed potential use of beetroot supplements as a natural option to help lower blood pressure. - Increase cardiovascular activity to 150 minutes of moderate exercise per week - Consider beetroot supplements or juice for natural blood pressure reduction  Tachycardia Heart rate remains tachycardic, likely exacerbated by Vyvanse . Propranolol  20 mg was prescribed but not consistently taken. Discussed taking propranolol  with Vyvanse  to manage heart rate without causing dizziness or feeling off. - Take propranolol  20 mg with Vyvanse  to manage heart rate  Premenstrual dysphoric disorder PMDD symptoms have improved with Claritin and Pepcid,  though symptoms worsen when medication is stopped during menstruation. Discussed the balance between managing symptoms and long-term medication use. - Continue Claritin and Pepcid for PMDD management      Return in about 3 months (around 02/09/2025) for recheck/follow-up, blood pressure check.    Subjective:    ADHD and HTN BP recheck.   HPI Discussed the use of AI scribe software for clinical note transcription with the patient, who gave verbal consent to proceed.  History of Present Illness Kelli Perez is a 39 year old female with hypertension and ADHD who presents for a blood pressure follow-up and ADHD management.  She notes persistent tachycardia, which she attributes to her Vyvanse  medication, taken at 30 mg daily. She has been prescribed propranolol  20 mg, which she has not taken today.  She feels better overall, noting that reducing her Vyvanse  dosage has helped with her symptoms. However, she continues to experience an elevated heart rate, which she monitors. She engages in physical activities such as biking and walking, although there was a lapse during the holiday season. She believes that losing weight could help lower her blood pressure.  She has been consulting with a functional medicine doctor regarding possible POTS and MCAS. She takes Claritin and Pepcid to manage histamine overload, which has also helped with her PMDD symptoms. She noticed a return of symptoms when she stopped these medications during her menstrual period.  She discusses her exercise routine, including using a stationary bike and going on walks with her children. She uses a stationary bike and goes on walks with her children. She is considering dietary adjustments, including tracking her nutrition to ensure adequate protein intake.     Past Medical History:  Diagnosis Date  Anemia    Anxiety    Asthma    Complication of anesthesia    Depressive disorder 09/11/2017   well-controlled    Ectopic pregnancy without intrauterine pregnancy 01/26/2022   GERD (gastroesophageal reflux disease)    Medical history non-contributory    PONV (postoperative nausea and vomiting)    Seizures (HCC)    SVD (spontaneous vaginal delivery) 05/22/2018    Past Surgical History:  Procedure Laterality Date   DIAGNOSTIC LAPAROSCOPY WITH REMOVAL OF ECTOPIC PREGNANCY N/A 01/25/2022   Procedure: DIAGNOSTIC LAPAROSCOPY WITH REMOVAL OF ECTOPIC PREGNANCY;  Surgeon: Barbette Knock, MD;  Location: [facility] OR;  Service: Gynecology;  Laterality: N/A;   lazer eye surgery Left 07/2024   SKIN SURGERY     birthmark removal on back    Family History  Problem Relation Age of Onset   Anxiety disorder Mother    Depression Mother    Obesity Mother    Varicose Veins Mother    Arthritis Father    Cancer Father        testicular   ADD / ADHD Sister    ADD / ADHD Brother    COPD Maternal Grandmother    Varicose Veins Maternal Grandmother    Cancer Maternal Grandfather    Asthma Paternal Grandmother    ADD / ADHD Son    Asthma Son    Asthma Son    Depression Maternal Aunt    Miscarriages / Stillbirths Maternal Aunt    Obesity Maternal Aunt    Varicose Veins Maternal Aunt    Arthritis Maternal Aunt    Miscarriages / Stillbirths Maternal Aunt    Obesity Maternal Aunt    Varicose Veins Maternal Aunt    Colon cancer Neg Hx    Breast cancer Neg Hx     Social History[1]   Allergies[2]  Review of Systems NEGATIVE UNLESS OTHERWISE INDICATED IN HPI      Objective:     BP 130/84   Pulse (!) 109   Temp 99.1 F (37.3 C) (Temporal)   Resp 16   Ht 5' 8 (1.727 m)   Wt 203 lb (92.1 kg)   LMP 11/05/2024 (Exact Date)   SpO2 98%   BMI 30.87 kg/m   Wt Readings from Last 3 Encounters:  11/11/24 203 lb (92.1 kg)  09/16/24 202 lb 3.2 oz (91.7 kg)  08/23/24 202 lb (91.6 kg)    BP Readings from Last 3 Encounters:  11/11/24 130/84  09/16/24 136/82  08/23/24 (!) 148/98     Physical Exam Vitals  and nursing note reviewed.  Constitutional:      General: She is not in acute distress.    Appearance: Normal appearance. She is not ill-appearing.  HENT:     Head: Normocephalic.     Right Ear: External ear normal.     Left Ear: External ear normal.  Eyes:     Extraocular Movements: Extraocular movements intact.     Conjunctiva/sclera: Conjunctivae normal.     Pupils: Pupils are equal, round, and reactive to light.  Cardiovascular:     Rate and Rhythm: Regular rhythm. Tachycardia present.     Heart sounds: Normal heart sounds.  Pulmonary:     Effort: Pulmonary effort is normal. No respiratory distress.     Breath sounds: Normal breath sounds. No wheezing.  Musculoskeletal:     Cervical back: Normal range of motion.  Skin:    General: Skin is warm.  Neurological:     Mental Status: She is alert  and oriented to person, place, and time.  Psychiatric:        Mood and Affect: Mood normal.        Behavior: Behavior normal.             Chairty Toman M Editha Bridgeforth, PA-C    [1]  Social History Tobacco Use   Smoking status: Never    Passive exposure: Never   Smokeless tobacco: Never  Vaping Use   Vaping status: Never Used  Substance Use Topics   Alcohol use: Not Currently   Drug use: Never  [2]  Allergies Allergen Reactions   Penicillins Hives   Sulfa Antibiotics Diarrhea and Nausea And Vomiting   Sulfamethoxazole Other (See Comments)   Cefprozil Other (See Comments)    Unknown   Cephalosporins Hives   "

## 2024-11-16 ENCOUNTER — Other Ambulatory Visit (HOSPITAL_BASED_OUTPATIENT_CLINIC_OR_DEPARTMENT_OTHER): Payer: Self-pay

## 2024-11-17 ENCOUNTER — Other Ambulatory Visit (HOSPITAL_BASED_OUTPATIENT_CLINIC_OR_DEPARTMENT_OTHER): Payer: Self-pay

## 2024-11-23 ENCOUNTER — Encounter: Payer: Self-pay | Admitting: Physician Assistant

## 2024-11-23 NOTE — Telephone Encounter (Signed)
 Please see pt msg and advise if recommending VV to discuss more in detail

## 2024-12-07 NOTE — Telephone Encounter (Signed)
 Please review and advise.

## 2024-12-09 NOTE — Telephone Encounter (Signed)
 LVM to schedule virtual with Allwardt for the week of 2/29/26

## 2024-12-09 NOTE — Telephone Encounter (Signed)
 Please contact pt to schedule VV with Alyssa next week

## 2024-12-10 ENCOUNTER — Other Ambulatory Visit (HOSPITAL_BASED_OUTPATIENT_CLINIC_OR_DEPARTMENT_OTHER): Payer: Self-pay

## 2024-12-10 ENCOUNTER — Encounter: Payer: Self-pay | Admitting: Physician Assistant

## 2024-12-10 ENCOUNTER — Telehealth: Admitting: Physician Assistant

## 2024-12-10 VITALS — Ht 68.0 in | Wt 203.0 lb

## 2024-12-10 DIAGNOSIS — F909 Attention-deficit hyperactivity disorder, unspecified type: Secondary | ICD-10-CM

## 2024-12-10 DIAGNOSIS — F3281 Premenstrual dysphoric disorder: Secondary | ICD-10-CM

## 2024-12-10 MED ORDER — JUNEL FE 24 1-20 MG-MCG(24) PO TABS
1.0000 | ORAL_TABLET | Freq: Every day | ORAL | 5 refills | Status: AC
  Filled 2024-12-10: qty 28, 28d supply, fill #0

## 2024-12-10 NOTE — Progress Notes (Signed)
" ° °  Virtual Visit via Video Note  I connected with  Kelli Perez  on 12/10/24 at  1:30 PM EST by a video enabled telemedicine application and verified that I am speaking with the correct person using two identifiers.  Location: Patient: home Provider: [employer] [facility] [employer] Persons present: Patient and myself   I discussed the limitations of evaluation and management by telemedicine and the availability of in person appointments. The patient expressed understanding and agreed to proceed.   History of Present Illness:  Discussed the use of AI scribe software for clinical note transcription with the patient, who gave verbal consent to proceed.  History of Present Illness Kelli Perez is a 39 year old female with premenstrual dysphoric disorder who presents for a discussion about her symptoms. She was accompanied by her youngest child.  She experiences significant irritability, anger, and relationship issues with her husband during her luteal phase. These symptoms have been persistent and impactful on her daily life.  She was previously on Zoloft  but discontinued it around October due to weight issues and lack of effectiveness during the luteal phase. While on Zoloft , she experienced disturbing dreams and numbness.  She has tried propranolol , which initially helped but lost effectiveness after a few days. She also tried a progesterone -only pill for about a year, which resulted in depression and weepiness.  Currently, she is taking a low dose of Vyvanse . Her symptoms have started to extend into her menstrual period, with mind racing and spiraling thoughts.  She has a history of trying various treatments, including consulting a functional medicine doctor and considering birth control options.     Observations/Objective:   Gen: Awake, alert, no acute distress Resp: Breathing is even and non-labored Psych: calm/pleasant demeanor Neuro: Alert and Oriented x 3, +  facial symmetry, speech is clear.   Assessment and Plan:  Assessment and Plan Assessment & Plan Premenstrual dysphoric disorder Symptoms of irritability, anger, and relationship issues during the luteal phase. Previous treatments with Zoloft  and propranolol  provided limited relief. Considering Lexapro and birth control as potential treatments. Discussed the possibility of using Lexapro during the luteal phase and the potential for rebound effects. Discussed the use of birth control to skip periods and manage symptoms. She expressed openness to trying birth control, which she has not tried before. Discussed potential side effects of birth control, including headaches and bloating, and the importance of monitoring blood pressure due to the risk of blood clots. She is not a smoker, reducing the risk of blood clots. - Prescribed Junel  birth control, instructed to skip placebo pills and take active pills continuously. - Monitor blood pressure regularly. - Follow up with gynecologist for additional input on birth control.  Attention-deficit hyperactivity disorder Currently managed with Lisdexamfetamine  dimesylate 30 mg daily. Discussed the potential benefit of adding an SSRI like Lexapro to help balance symptoms and improve focus. - Continue Lisdexamfetamine  dimesylate 30 mg daily. - Will consider adding Lexapro to help balance ADHD symptoms.    Follow Up Instructions:    I discussed the assessment and treatment plan with the patient. The patient was provided an opportunity to ask questions and all were answered. The patient agreed with the plan and demonstrated an understanding of the instructions.   The patient was advised to call back or seek an in-person evaluation if the symptoms worsen or if the condition fails to improve as anticipated.  Minas Bonser M Abdulkadir Emmanuel, PA-C  "

## 2024-12-10 NOTE — Patient Instructions (Signed)
 Start on birth control pills this weekend. SKIP the placebo pills. Let's try to avoid having cycles altogether and see if this calms the PMDD down.   You're doing great! Things are going to get better.

## 2024-12-24 ENCOUNTER — Ambulatory Visit: Admitting: Physician Assistant

## 2025-02-10 ENCOUNTER — Ambulatory Visit: Admitting: Physician Assistant
